# Patient Record
Sex: Female | Born: 1965
Health system: Southern US, Community
[De-identification: ages and names within clinical notes are randomized; demographics above are authoritative.]

## PROBLEM LIST (undated history)

## (undated) DIAGNOSIS — E785 Hyperlipidemia, unspecified: Secondary | ICD-10-CM

## (undated) DIAGNOSIS — R011 Cardiac murmur, unspecified: Secondary | ICD-10-CM

## (undated) DIAGNOSIS — J45909 Unspecified asthma, uncomplicated: Secondary | ICD-10-CM

## (undated) DIAGNOSIS — T8859XA Other complications of anesthesia, initial encounter: Secondary | ICD-10-CM

## (undated) DIAGNOSIS — Z973 Presence of spectacles and contact lenses: Secondary | ICD-10-CM

## (undated) DIAGNOSIS — I10 Essential (primary) hypertension: Secondary | ICD-10-CM

## (undated) DIAGNOSIS — E119 Type 2 diabetes mellitus without complications: Secondary | ICD-10-CM

## (undated) DIAGNOSIS — M1990 Unspecified osteoarthritis, unspecified site: Secondary | ICD-10-CM

## (undated) DIAGNOSIS — K219 Gastro-esophageal reflux disease without esophagitis: Secondary | ICD-10-CM

## (undated) DIAGNOSIS — T4145XA Adverse effect of unspecified anesthetic, initial encounter: Secondary | ICD-10-CM

## (undated) HISTORY — PX: TUBAL LIGATION: SHX77

## (undated) HISTORY — PX: ABDOMINAL HYSTERECTOMY: SHX81

## (undated) HISTORY — PX: TONSILLECTOMY: SUR1361

---

## 2012-05-12 ENCOUNTER — Encounter (HOSPITAL_BASED_OUTPATIENT_CLINIC_OR_DEPARTMENT_OTHER): Payer: Self-pay | Admitting: *Deleted

## 2012-05-12 ENCOUNTER — Other Ambulatory Visit: Payer: Self-pay | Admitting: Orthopedic Surgery

## 2012-05-12 NOTE — Progress Notes (Signed)
Pt at work-does not know her meds-one for htn-colesterol, pain is vicodin-to bring all meds-had never seen cardiologist pcp dr Sara Francis [facility]

## 2012-05-12 NOTE — H&P (Signed)
Sara Francis is an 47 y.o. female.   Chief Complaint: Right Knee Pain HPI: Patient reports that the anesthetic and cortisone injection in her right knee on 05/04/2012 lasted for exactly 2 hours, but gave excellent pain relief.  She is still limping with an antalgic gait and has pain along the medial joint line of her right knee and desires arthroscopic evaluation and treatment.  She has 1-2 mm of cartilage remaining to the medial compartment of the right knee and is bone-on-bone on the left knee, which is not symptomatic.  Past Medical History  Diagnosis Date  . Hypertension   . Hyperlipemia   . GERD (gastroesophageal reflux disease)     used to take nexium  . Arthritis   . Wears glasses   . Asthma   . Complication of anesthesia     hard to wake up    Past Surgical History  Procedure Laterality Date  . Abdominal hysterectomy    . Tonsillectomy    . Tubal ligation      History reviewed. No pertinent family history. Social History:  reports that she has never smoked. She does not have any smokeless tobacco history on file. She reports that she does not drink alcohol or use illicit drugs.  Allergies:  Allergies  Allergen Reactions  . Benadryl (Diphenhydramine Hcl) Swelling  . Betadine (Povidone Iodine) Itching  . Contrast Media (Iodinated Diagnostic Agents) Swelling  . Latex Itching    Swelling-even if touched by latex gloves    No prescriptions prior to admission    No results found for this or any previous visit (from the past 48 hour(s)). No results found.  Review of Systems  Constitutional: Negative.   HENT: Negative.   Eyes: Negative.   Respiratory: Negative.   Cardiovascular: Negative.   Gastrointestinal: Negative.   Genitourinary: Negative.   Musculoskeletal: Positive for joint pain.  Skin: Negative.   Neurological: Negative.   Endo/Heme/Allergies: Negative.   Psychiatric/Behavioral: Negative.     Height 5\' 7"  (1.702 m), weight 87.091 kg (192  lb). Physical Exam  Constitutional: She is oriented to person, place, and time. She appears well-developed and well-nourished.  HENT:  Head: Normocephalic.  Cardiovascular: Normal heart sounds.   Respiratory: Breath sounds normal.  GI: Soft. Bowel sounds are normal.  Neurological: She is alert and oriented to person, place, and time.  Skin: Skin is warm and dry.  Psychiatric: She has a normal mood and affect.     Assessment/Plan Assess: Probable degenerative acute on chronic tearing of the medial meniscus or chondromalacia, medial compartment right knee.  Having failed conservative treatment with observation, anti-inflammatory medicine in the past and now anesthetic and cortisone injection that provided temporary relief.  Plan: After discussing options and reviewing the risks and benefits of arthroscopy.  The patient would like to proceed with arthroscopic intervention.  I will see her back at the time of surgery.  She has no history of heart attack, stroke, bleeding ulcers or out-of-control diabetes.  She is in acceptable anesthetic risk.  Sara Francis M. 05/12/2012, 11:41 AM

## 2012-05-15 ENCOUNTER — Encounter (HOSPITAL_BASED_OUTPATIENT_CLINIC_OR_DEPARTMENT_OTHER)
Admission: RE | Disposition: A | Discharge status: Home / Self Care | Payer: Self-pay | Source: Ambulatory Visit | Attending: Orthopedic Surgery

## 2012-05-15 ENCOUNTER — Ambulatory Visit (HOSPITAL_BASED_OUTPATIENT_CLINIC_OR_DEPARTMENT_OTHER)
Admission: RE | Admit: 2012-05-15 | Discharge: 2012-05-15 | Disposition: A | Discharge status: Home / Self Care | Payer: BC Managed Care – PPO | Source: Ambulatory Visit | Attending: Orthopedic Surgery | Admitting: Orthopedic Surgery

## 2012-05-15 ENCOUNTER — Encounter (HOSPITAL_BASED_OUTPATIENT_CLINIC_OR_DEPARTMENT_OTHER): Payer: Self-pay | Admitting: Anesthesiology

## 2012-05-15 ENCOUNTER — Encounter (HOSPITAL_BASED_OUTPATIENT_CLINIC_OR_DEPARTMENT_OTHER): Payer: Self-pay | Admitting: *Deleted

## 2012-05-15 ENCOUNTER — Ambulatory Visit (HOSPITAL_BASED_OUTPATIENT_CLINIC_OR_DEPARTMENT_OTHER): Payer: BC Managed Care – PPO | Admitting: Anesthesiology

## 2012-05-15 DIAGNOSIS — M25569 Pain in unspecified knee: Secondary | ICD-10-CM | POA: Insufficient documentation

## 2012-05-15 DIAGNOSIS — I10 Essential (primary) hypertension: Secondary | ICD-10-CM | POA: Insufficient documentation

## 2012-05-15 DIAGNOSIS — M23305 Other meniscus derangements, unspecified medial meniscus, unspecified knee: Secondary | ICD-10-CM | POA: Insufficient documentation

## 2012-05-15 DIAGNOSIS — M1711 Unilateral primary osteoarthritis, right knee: Secondary | ICD-10-CM

## 2012-05-15 DIAGNOSIS — E785 Hyperlipidemia, unspecified: Secondary | ICD-10-CM | POA: Insufficient documentation

## 2012-05-15 DIAGNOSIS — M25469 Effusion, unspecified knee: Secondary | ICD-10-CM | POA: Insufficient documentation

## 2012-05-15 DIAGNOSIS — M2240 Chondromalacia patellae, unspecified knee: Secondary | ICD-10-CM | POA: Insufficient documentation

## 2012-05-15 HISTORY — DX: Cardiac murmur, unspecified: R01.1

## 2012-05-15 HISTORY — DX: Gastro-esophageal reflux disease without esophagitis: K21.9

## 2012-05-15 HISTORY — DX: Unspecified asthma, uncomplicated: J45.909

## 2012-05-15 HISTORY — DX: Presence of spectacles and contact lenses: Z97.3

## 2012-05-15 HISTORY — DX: Other complications of anesthesia, initial encounter: T88.59XA

## 2012-05-15 HISTORY — DX: Hyperlipidemia, unspecified: E78.5

## 2012-05-15 HISTORY — DX: Adverse effect of unspecified anesthetic, initial encounter: T41.45XA

## 2012-05-15 HISTORY — DX: Essential (primary) hypertension: I10

## 2012-05-15 HISTORY — DX: Unspecified osteoarthritis, unspecified site: M19.90

## 2012-05-15 HISTORY — PX: KNEE ARTHROSCOPY: SHX127

## 2012-05-15 LAB — POCT I-STAT, CHEM 8
Calcium, Ion: 1.1 mmol/L — ABNORMAL LOW (ref 1.12–1.23)
HCT: 40 % (ref 36.0–46.0)
Hemoglobin: 13.6 g/dL (ref 12.0–15.0)
TCO2: 25 mmol/L (ref 0–100)

## 2012-05-15 SURGERY — ARTHROSCOPY, KNEE
Anesthesia: General | Site: Knee | Laterality: Right | Wound class: Clean

## 2012-05-15 MED ORDER — BUPIVACAINE-EPINEPHRINE 0.5% -1:200000 IJ SOLN
INTRAMUSCULAR | Status: DC | PRN
  Administered 2012-05-15: 30 mL

## 2012-05-15 MED ORDER — OXYCODONE HCL 5 MG/5ML PO SOLN: 5.0000 mg | Freq: Once | ORAL | Status: DC | PRN

## 2012-05-15 MED ORDER — LIDOCAINE HCL (CARDIAC) 20 MG/ML IV SOLN
INTRAVENOUS | Status: DC | PRN
  Administered 2012-05-15: 20 mg via INTRAVENOUS

## 2012-05-15 MED ORDER — PROMETHAZINE HCL 25 MG/ML IJ SOLN: 6.2500 mg | INTRAMUSCULAR | Status: DC | PRN

## 2012-05-15 MED ORDER — SUCCINYLCHOLINE CHLORIDE 20 MG/ML IJ SOLN
INTRAMUSCULAR | Status: DC | PRN
  Administered 2012-05-15: 100 mg via INTRAVENOUS

## 2012-05-15 MED ORDER — SODIUM CHLORIDE 0.9 % IR SOLN
Status: DC | PRN
  Administered 2012-05-15: 12:00:00

## 2012-05-15 MED ORDER — OXYCODONE HCL 5 MG PO TABS
5.0000 mg | ORAL_TABLET | Freq: Once | ORAL | Status: DC | PRN
Start: 2012-05-15 — End: 2012-05-15

## 2012-05-15 MED ORDER — FENTANYL CITRATE 0.05 MG/ML IJ SOLN: 50.0000 ug | INTRAMUSCULAR | Status: DC | PRN

## 2012-05-15 MED ORDER — ACETAMINOPHEN 10 MG/ML IV SOLN
1000.0000 mg | Freq: Once | INTRAVENOUS | Status: AC
  Administered 2012-05-15: 1000 mg via INTRAVENOUS

## 2012-05-15 MED ORDER — LACTATED RINGERS IV SOLN
INTRAVENOUS | Status: DC
  Administered 2012-05-15 (×2): via INTRAVENOUS

## 2012-05-15 MED ORDER — MIDAZOLAM HCL 5 MG/5ML IJ SOLN
INTRAMUSCULAR | Status: DC | PRN
  Administered 2012-05-15: 2 mg via INTRAVENOUS

## 2012-05-15 MED ORDER — PROPOFOL 10 MG/ML IV BOLUS
INTRAVENOUS | Status: DC | PRN
  Administered 2012-05-15: 100 mg via INTRAVENOUS
  Administered 2012-05-15: 200 mg via INTRAVENOUS

## 2012-05-15 MED ORDER — MIDAZOLAM HCL 2 MG/2ML IJ SOLN
0.5000 mg | Freq: Once | INTRAMUSCULAR | Status: DC | PRN
Start: 2012-05-15 — End: 2012-05-15

## 2012-05-15 MED ORDER — ONDANSETRON HCL 4 MG/2ML IJ SOLN
INTRAMUSCULAR | Status: DC | PRN
  Administered 2012-05-15: 4 mg via INTRAVENOUS

## 2012-05-15 MED ORDER — CHLORHEXIDINE GLUCONATE 4 % EX LIQD: 60.0000 mL | Freq: Once | CUTANEOUS | Status: DC

## 2012-05-15 MED ORDER — FENTANYL CITRATE 0.05 MG/ML IJ SOLN
INTRAMUSCULAR | Status: DC | PRN
  Administered 2012-05-15 (×2): 50 ug via INTRAVENOUS
  Administered 2012-05-15: 100 ug via INTRAVENOUS

## 2012-05-15 MED ORDER — DEXAMETHASONE SODIUM PHOSPHATE 4 MG/ML IJ SOLN
INTRAMUSCULAR | Status: DC | PRN
  Administered 2012-05-15: 10 mg via INTRAVENOUS

## 2012-05-15 MED ORDER — MIDAZOLAM HCL 2 MG/2ML IJ SOLN: 1.0000 mg | INTRAMUSCULAR | Status: DC | PRN

## 2012-05-15 MED ORDER — HYDROCODONE-ACETAMINOPHEN 5-325 MG PO TABS: 1.0000 | ORAL_TABLET | ORAL | Status: DC | PRN

## 2012-05-15 MED ORDER — DEXTROSE-NACL 5-0.45 % IV SOLN: INTRAVENOUS | Status: DC

## 2012-05-15 MED ORDER — CEFAZOLIN SODIUM-DEXTROSE 2-3 GM-% IV SOLR
2.0000 g | INTRAVENOUS | Status: AC
  Administered 2012-05-15: 2 g via INTRAVENOUS

## 2012-05-15 MED ORDER — FENTANYL CITRATE 0.05 MG/ML IJ SOLN
25.0000 ug | INTRAMUSCULAR | Status: DC | PRN
  Administered 2012-05-15 (×2): 25 ug via INTRAVENOUS

## 2012-05-15 MED ORDER — MEPERIDINE HCL 25 MG/ML IJ SOLN: 6.2500 mg | INTRAMUSCULAR | Status: DC | PRN

## 2012-05-15 SURGICAL SUPPLY — 42 items
BANDAGE ELASTIC 6 VELCRO ST LF (GAUZE/BANDAGES/DRESSINGS) ×2 IMPLANT
BLADE 4.2CUDA (BLADE) IMPLANT
BLADE CUTTER GATOR 3.5 (BLADE) ×2 IMPLANT
BLADE GREAT WHITE 4.2 (BLADE) IMPLANT
CANISTER OMNI JUG 16 LITER (MISCELLANEOUS) IMPLANT
CANISTER SUCTION 2500CC (MISCELLANEOUS) IMPLANT
CHLORAPREP W/TINT 26ML (MISCELLANEOUS) ×4 IMPLANT
CLOTH BEACON ORANGE TIMEOUT ST (SAFETY) ×2 IMPLANT
DRAPE ARTHROSCOPY W/POUCH 114 (DRAPES) ×2 IMPLANT
ELECT MENISCUS 165MM 90D (ELECTRODE) IMPLANT
ELECT REM PT RETURN 9FT ADLT (ELECTROSURGICAL)
ELECTRODE REM PT RTRN 9FT ADLT (ELECTROSURGICAL) IMPLANT
GAUZE XEROFORM 1X8 LF (GAUZE/BANDAGES/DRESSINGS) ×2 IMPLANT
GLOVE BIO SURGEON STRL SZ7 (GLOVE) IMPLANT
GLOVE BIO SURGEON STRL SZ7.5 (GLOVE) IMPLANT
GLOVE BIOGEL PI IND STRL 7.0 (GLOVE) ×2 IMPLANT
GLOVE BIOGEL PI IND STRL 8 (GLOVE) ×1 IMPLANT
GLOVE BIOGEL PI INDICATOR 7.0 (GLOVE) ×2
GLOVE BIOGEL PI INDICATOR 8 (GLOVE) ×1
GLOVE SKINSENSE NS SZ6.5 (GLOVE) ×1
GLOVE SKINSENSE NS SZ7.0 (GLOVE) ×1
GLOVE SKINSENSE NS SZ7.5 (GLOVE) ×1
GLOVE SKINSENSE STRL SZ6.5 (GLOVE) ×1 IMPLANT
GLOVE SKINSENSE STRL SZ7.0 (GLOVE) ×1 IMPLANT
GLOVE SKINSENSE STRL SZ7.5 (GLOVE) ×1 IMPLANT
GOWN PREVENTION PLUS XLARGE (GOWN DISPOSABLE) ×4 IMPLANT
IV NS IRRIG 3000ML ARTHROMATIC (IV SOLUTION) ×4 IMPLANT
KNEE WRAP E Z 3 GEL PACK (MISCELLANEOUS) ×2 IMPLANT
NDL SAFETY ECLIPSE 18X1.5 (NEEDLE) ×1 IMPLANT
NEEDLE HYPO 18GX1.5 SHARP (NEEDLE) ×1
PACK ARTHROSCOPY DSU (CUSTOM PROCEDURE TRAY) ×2 IMPLANT
PACK BASIN DAY SURGERY FS (CUSTOM PROCEDURE TRAY) ×2 IMPLANT
PENCIL BUTTON HOLSTER BLD 10FT (ELECTRODE) IMPLANT
SET ARTHROSCOPY TUBING (MISCELLANEOUS) ×1
SET ARTHROSCOPY TUBING LN (MISCELLANEOUS) ×1 IMPLANT
SLEEVE SCD COMPRESS KNEE MED (MISCELLANEOUS) IMPLANT
SPONGE GAUZE 4X4 12PLY (GAUZE/BANDAGES/DRESSINGS) ×2 IMPLANT
SYR 3ML 18GX1 1/2 (SYRINGE) ×2 IMPLANT
SYR 5ML LL (SYRINGE) IMPLANT
TOWEL OR 17X24 6PK STRL BLUE (TOWEL DISPOSABLE) ×2 IMPLANT
WAND STAR VAC 90 (SURGICAL WAND) IMPLANT
WATER STERILE IRR 1000ML POUR (IV SOLUTION) ×2 IMPLANT

## 2012-05-15 NOTE — Op Note (Signed)
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   Procedure: Right knee arthroscopic chondroplasty medial femoral condyle  Surgeon: Feliberto Gottron. Turner Daniels M.D.  Assist: Shirl Harris PA-C  Anes: General LMA  EBL: Minimal  Fluids: 800 cc   Indications: Greater than three-month history of right knee effusion with catching popping and pain. Has had previous arthroscopic surgery on the knee many years ago that worked well. Reports that these symptoms are similar to those that she had 10 years ago. Pain is almost made her fall down a number of times she walks with antalgic gait and the pain also wakes her up at night. It does interfere with chores and work. Pt has failed conservative treatment with anti-inflammatory medicines, physical therapy, and modified activites but did get good temporarily from an intra-articular cortisone injection. Pain has recurred and patient desires elective arthroscopic evaluation and treatment of knee. Risks and benefits of surgery have been discussed and questions answered.  Procedure: Patient identified by arm band and taken to the operating room at the day surgery [facility]. The appropriate anesthetic monitors were attached, and General LMA anesthesia was induced without difficulty. Lateral post was applied to the table and the lower extremity was prepped and draped in usual sterile fashion from the ankle to the midthigh. Time out procedure was performed. We began the operation by making standard inferior lateral and inferior medial peripatellar portals with a #11 blade allowing introduction of the arthroscope through the inferior lateral portal and the out flow to the inferior medial portal. Pump pressure was set at 100 mmHg and diagnostic arthroscopy  revealed normal patellofemoral articular cartilage, the medial compartment meniscus was normal the  medial femoral condyle had 2 areas of grade 3 chondromalacia with flap tears debrided back to a stable margin with a 3.5 mm Gator sucker shaver. He tear chondromalacia was about 10 mm in diameter. The anterior cruciate ligament and PCL were intact and probed the lateral compartment articular and meniscal cartilages were in excellent condition. The gutters were cleared medially and laterally. The knee was irrigated out normal saline solution. A dressing of xerofoam 4 x 4 dressing sponges, web roll and an Ace wrap was applied. The patient was awakened extubated and taken to the recovery without difficulty.    Signed: Nestor Lewandowsky, MD

## 2012-05-15 NOTE — Interval H&P Note (Signed)
History and Physical Interval Note:  05/15/2012 11:28 AM  Sara Francis  has presented today for surgery, with the diagnosis of Right Knee Medial Meniscal Tear Chondomylasia  The various methods of treatment have been discussed with the patient and family. After consideration of risks, benefits and other options for treatment, the patient has consented to  Procedure(s): RIGHT KNEE ARTHROSCOPY  (Right) as a surgical intervention .  The patient's history has been reviewed, patient examined, no change in status, stable for surgery.  I have reviewed the patient's chart and labs.  Questions were answered to the patient's satisfaction.     Nestor Lewandowsky

## 2012-05-15 NOTE — Anesthesia Procedure Notes (Addendum)
Performed by: Signa Kell C   Procedure Name: Intubation Date/Time: 05/15/2012 11:47 AM Performed by: Burna Cash Pre-anesthesia Checklist: Patient identified, Emergency Drugs available, Suction available and Patient being monitored Patient Re-evaluated:Patient Re-evaluated prior to inductionOxygen Delivery Method: Circle System Utilized Preoxygenation: Pre-oxygenation with 100% oxygen Intubation Type: IV induction Ventilation: Mask ventilation without difficulty Laryngoscope Size: Mac and 3 Grade View: Grade I Tube type: Oral Tube size: 7.0 mm Number of attempts: 1 Airway Equipment and Method: stylet and oral airway Placement Confirmation: ETT inserted through vocal cords under direct vision,  positive ETCO2 and breath sounds checked- equal and bilateral Secured at: 22 cm Tube secured with: Tape Dental Injury: Teeth and Oropharynx as per pre-operative assessment

## 2012-05-15 NOTE — Interval H&P Note (Signed)
History and Physical Interval Note:  05/15/2012 11:29 AM  Sara Francis  has presented today for surgery, with the diagnosis of Right Knee Medial Meniscal Tear Chondomylasia  The various methods of treatment have been discussed with the patient and family. After consideration of risks, benefits and other options for treatment, the patient has consented to  Procedure(s): RIGHT KNEE ARTHROSCOPY  (Right) as a surgical intervention .  The patient's history has been reviewed, patient examined, no change in status, stable for surgery.  I have reviewed the patient's chart and labs.  Questions were answered to the patient's satisfaction.     Nestor Lewandowsky

## 2012-05-15 NOTE — Anesthesia Preprocedure Evaluation (Signed)
Anesthesia Evaluation  Patient identified by MRN, date of birth, ID band Patient awake    Reviewed: Allergy & Precautions, H&P , NPO status , Patient's Chart, lab work & pertinent test results  History of Anesthesia Complications (+) PROLONGED EMERGENCE  Airway Mallampati: II TM Distance: >3 FB Neck ROM: Full    Dental  (+) Dental Advisory Given   Pulmonary asthma (wheezes daily) ,  breath sounds clear to auscultation  Pulmonary exam normal       Cardiovascular hypertension, Pt. on medications Rhythm:Regular Rate:Normal     Neuro/Psych negative neurological ROS  negative psych ROS   GI/Hepatic Neg liver ROS, GERD-  Poorly Controlled and Medicated,  Endo/Other  negative endocrine ROS  Renal/GU negative Renal ROS     Musculoskeletal   Abdominal (+) + obese,   Peds  Hematology negative hematology ROS (+)   Anesthesia Other Findings   Reproductive/Obstetrics negative OB ROS                           Anesthesia Physical Anesthesia Plan  ASA: II  Anesthesia Plan: General   Post-op Pain Management:    Induction: Intravenous  Airway Management Planned: Oral ETT  Additional Equipment:   Intra-op Plan:   Post-operative Plan: Extubation in OR  Informed Consent: I have reviewed the patients History and Physical, chart, labs and discussed the procedure including the risks, benefits and alternatives for the proposed anesthesia with the patient or authorized representative who has indicated his/her understanding and acceptance.   Dental advisory given  Plan Discussed with: Surgeon and CRNA  Anesthesia Plan Comments: (Plan routine monitors, GETA)        Anesthesia Quick Evaluation

## 2012-05-15 NOTE — Transfer of Care (Signed)
Immediate Anesthesia Transfer of Care Note  Patient: Sara Francis  Procedure(s) Performed: Procedure(s): RIGHT KNEE ARTHROSCOPY  and CHONDROPLASTY (Right)  Patient Location: PACU  Anesthesia Type:General  Level of Consciousness: awake, alert  and oriented  Airway & Oxygen Therapy: Patient Spontanous Breathing and Patient connected to face mask oxygen  Post-op Assessment: Report given to PACU RN and Post -op Vital signs reviewed and stable  Post vital signs: Reviewed and stable  Complications: No apparent anesthesia complications

## 2012-05-15 NOTE — Anesthesia Postprocedure Evaluation (Signed)
  Anesthesia Post-op Note  Patient: Sara Francis  Procedure(s) Performed: Procedure(s): RIGHT KNEE ARTHROSCOPY  and CHONDROPLASTY (Right)  Patient Location: PACU  Anesthesia Type:General  Level of Consciousness: awake, oriented and patient cooperative, but sleepy  Airway and Oxygen Therapy: Patient Spontanous Breathing  Post-op Pain: mild  Post-op Assessment: Post-op Vital signs reviewed, Patient's Cardiovascular Status Stable, Respiratory Function Stable, Patent Airway, No signs of Nausea or vomiting and Pain level controlled  Post-op Vital Signs: Reviewed and stable  Complications: No apparent anesthesia complications

## 2012-05-16 ENCOUNTER — Encounter (HOSPITAL_BASED_OUTPATIENT_CLINIC_OR_DEPARTMENT_OTHER): Payer: Self-pay | Admitting: Orthopedic Surgery

## 2012-06-26 ENCOUNTER — Other Ambulatory Visit: Payer: Self-pay | Admitting: Orthopedic Surgery

## 2012-06-26 DIAGNOSIS — M79641 Pain in right hand: Secondary | ICD-10-CM

## 2012-06-27 ENCOUNTER — Inpatient Hospital Stay: Admission: RE | Admit: 2012-06-27 | Payer: BC Managed Care – PPO | Source: Ambulatory Visit

## 2012-07-04 ENCOUNTER — Ambulatory Visit
Admission: RE | Admit: 2012-07-04 | Discharge: 2012-07-04 | Disposition: A | Discharge status: Home / Self Care | Payer: BC Managed Care – PPO | Source: Ambulatory Visit | Attending: Orthopedic Surgery | Admitting: Orthopedic Surgery

## 2012-07-04 DIAGNOSIS — M79641 Pain in right hand: Secondary | ICD-10-CM

## 2012-09-15 ENCOUNTER — Other Ambulatory Visit: Payer: Self-pay | Admitting: Orthopedic Surgery

## 2012-09-15 DIAGNOSIS — S62102A Fracture of unspecified carpal bone, left wrist, initial encounter for closed fracture: Secondary | ICD-10-CM

## 2012-09-20 ENCOUNTER — Ambulatory Visit
Admission: RE | Admit: 2012-09-20 | Discharge: 2012-09-20 | Disposition: A | Discharge status: Home / Self Care | Payer: BC Managed Care – PPO | Source: Ambulatory Visit | Attending: Orthopedic Surgery | Admitting: Orthopedic Surgery

## 2012-09-20 ENCOUNTER — Other Ambulatory Visit: Payer: BC Managed Care – PPO

## 2012-09-20 DIAGNOSIS — S62102A Fracture of unspecified carpal bone, left wrist, initial encounter for closed fracture: Secondary | ICD-10-CM

## 2014-04-17 ENCOUNTER — Emergency Department (HOSPITAL_BASED_OUTPATIENT_CLINIC_OR_DEPARTMENT_OTHER)
Admission: EM | Admit: 2014-04-17 | Discharge: 2014-04-17 | Disposition: A | Discharge status: Home / Self Care | Payer: Self-pay | Attending: Emergency Medicine | Admitting: Emergency Medicine

## 2014-04-17 ENCOUNTER — Emergency Department (HOSPITAL_BASED_OUTPATIENT_CLINIC_OR_DEPARTMENT_OTHER): Payer: Self-pay

## 2014-04-17 ENCOUNTER — Encounter (HOSPITAL_BASED_OUTPATIENT_CLINIC_OR_DEPARTMENT_OTHER): Payer: Self-pay | Admitting: *Deleted

## 2014-04-17 DIAGNOSIS — S8392XA Sprain of unspecified site of left knee, initial encounter: Secondary | ICD-10-CM | POA: Insufficient documentation

## 2014-04-17 DIAGNOSIS — M1990 Unspecified osteoarthritis, unspecified site: Secondary | ICD-10-CM | POA: Insufficient documentation

## 2014-04-17 DIAGNOSIS — W108XXA Fall (on) (from) other stairs and steps, initial encounter: Secondary | ICD-10-CM | POA: Insufficient documentation

## 2014-04-17 DIAGNOSIS — Z79899 Other long term (current) drug therapy: Secondary | ICD-10-CM | POA: Insufficient documentation

## 2014-04-17 DIAGNOSIS — Y9289 Other specified places as the place of occurrence of the external cause: Secondary | ICD-10-CM | POA: Insufficient documentation

## 2014-04-17 DIAGNOSIS — S63502A Unspecified sprain of left wrist, initial encounter: Secondary | ICD-10-CM | POA: Insufficient documentation

## 2014-04-17 DIAGNOSIS — R011 Cardiac murmur, unspecified: Secondary | ICD-10-CM | POA: Insufficient documentation

## 2014-04-17 DIAGNOSIS — Z8639 Personal history of other endocrine, nutritional and metabolic disease: Secondary | ICD-10-CM | POA: Insufficient documentation

## 2014-04-17 DIAGNOSIS — W19XXXA Unspecified fall, initial encounter: Secondary | ICD-10-CM

## 2014-04-17 DIAGNOSIS — Y9389 Activity, other specified: Secondary | ICD-10-CM | POA: Insufficient documentation

## 2014-04-17 DIAGNOSIS — K219 Gastro-esophageal reflux disease without esophagitis: Secondary | ICD-10-CM | POA: Insufficient documentation

## 2014-04-17 DIAGNOSIS — I10 Essential (primary) hypertension: Secondary | ICD-10-CM | POA: Insufficient documentation

## 2014-04-17 DIAGNOSIS — J45909 Unspecified asthma, uncomplicated: Secondary | ICD-10-CM | POA: Insufficient documentation

## 2014-04-17 DIAGNOSIS — Z91040 Latex allergy status: Secondary | ICD-10-CM | POA: Insufficient documentation

## 2014-04-17 DIAGNOSIS — Y998 Other external cause status: Secondary | ICD-10-CM | POA: Insufficient documentation

## 2014-04-17 MED ORDER — IBUPROFEN 800 MG PO TABS
800.0000 mg | ORAL_TABLET | Freq: Once | ORAL | Status: AC
  Administered 2014-04-17: 800 mg via ORAL
  Filled 2014-04-17: qty 1

## 2014-04-17 MED ORDER — IBUPROFEN 800 MG PO TABS: 800.0000 mg | ORAL_TABLET | Freq: Three times a day (TID) | ORAL | Status: DC | PRN

## 2014-04-17 NOTE — ED Provider Notes (Signed)
CSN: [id number]     Arrival date & time 04/17/14  1729 History  This chart was scribed for Audree CamelScott T Iracema Lanagan, MD by [facility] LPNadim Abu Hashem, ED Scribe. The patient was seen in MH04/[facility]04 and the patient's care was started at 6:10 PM.  Chief Complaint  Patient presents with  . Knee Pain   Patient is a 49 y.o. female presenting with knee pain. The history is provided by the patient. No language interpreter was used.  Knee Pain   HPI Comments: Sara Francis is a 49 y.o. female who presents to the Emergency Department complaining of left knee pain and left wrist pain due to a fall which occurred last week. Pt fell while walking down the stairs. She says her legs gave out and she tripped. She states her left knee and left wrist are swollen. Pt has pain with ambulating. Pt has tried alternating ice and heat for no relief. She has not tried OTC medication. No trauma to her head, no LOC and no ankle pain.  Past Medical History  Diagnosis Date  . Hyperlipemia   . GERD (gastroesophageal reflux disease)     used to take nexium  . Arthritis   . Wears glasses   . Asthma   . Complication of anesthesia     hard to wake up  . Heart murmur   . Hypertension     under control with med., has been on med. x "a while"    Past Surgical History  Procedure Laterality Date  . Abdominal hysterectomy    . Tonsillectomy    . Tubal ligation    . Knee arthroscopy Right 05/15/2012    Procedure: RIGHT KNEE ARTHROSCOPY  and CHONDROPLASTY;  Surgeon: Nestor LewandowskyFrank J Rowan, MD;  Location: [facility];  Service: Orthopedics;  Laterality: Right;   No family history on file. History  Substance Use Topics  . Smoking status: Never Smoker   . Smokeless tobacco: Not on file  . Alcohol Use: No   OB History    No data available     Review of Systems  Musculoskeletal: Positive for joint swelling, arthralgias and gait problem.  All other systems reviewed and are negative.  Allergies  Benadryl; Betadine; Contrast  media; and Latex  Home Medications   Prior to Admission medications   Medication Sig Start Date End Date Taking? Authorizing Provider  albuterol (PROVENTIL HFA;VENTOLIN HFA) 108 (90 BASE) MCG/ACT inhaler Inhale 2 puffs into the lungs every 6 (six) hours as needed for wheezing.   Yes Historical Provider, MD  pantoprazole (PROTONIX) 40 MG tablet Take 40 mg by mouth daily.   Yes Historical Provider, MD  amLODipine (NORVASC) 5 MG tablet Take 5 mg by mouth.    Historical Provider, MD  HYDROcodone-acetaminophen (NORCO) 5-325 MG per tablet Take 1-2 tablets by mouth every 4 (four) hours as needed for pain. 05/15/12   Alessandra BevelsJennifer M. Williams, PA-C   BP 176/90 mmHg  Pulse 92  Temp(Src) 99 F (37.2 C) (Oral)  Resp 18  SpO2 100% Physical Exam  Constitutional: She is oriented to person, place, and time. She appears well-developed and well-nourished. No distress.  HENT:  Head: Normocephalic and atraumatic.  Eyes: Right eye exhibits no discharge. Left eye exhibits no discharge.  Cardiovascular:  Pulses:      Radial pulses are 2+ on the right side, and 2+ on the left side.       Dorsalis pedis pulses are 2+ on the right side, and 2+ on the left  side.  Pulmonary/Chest: Effort normal. No respiratory distress.  Musculoskeletal: She exhibits tenderness.  Left wrist: diffuse tenderness. Mildly limited ROM. Mild swelling Normal strength and sensation. Left knee: Diffuse tenderness. No significant swelling. Mildly limited ROM.  Neurological: She is alert and oriented to person, place, and time.  Skin: Skin is warm and dry.  Psychiatric: She has a normal mood and affect. Her behavior is normal.  Nursing note and vitals reviewed.   ED Course  Procedures  DIAGNOSTIC STUDIES: Oxygen Saturation is 100% on room air, normal by my interpretation.    COORDINATION OF CARE: 6:15 PM Discussed treatment plan with pt at bedside and pt agreed to plan.  Labs Review Labs Reviewed - No data to display  Imaging  Review Dg Wrist Complete Left  04/17/2014   CLINICAL DATA:  Status post fall downstairs all week ago. Left wrist pain.  EXAM: LEFT WRIST - COMPLETE 3+ VIEW  COMPARISON:  CT of the left left wrist 11/17/2012.  FINDINGS: No acute bony or joint abnormality is identified. Soft tissues about the wrist appear mildly swollen. Ulnar minus variance is noted.  IMPRESSION: Soft tissue swelling without acute bony or joint abnormality.  Ulnar minus variance.   Electronically Signed   By: Drusilla Kanner M.D.   On: 04/17/2014 19:02   Dg Knee Complete 4 Views Left  04/17/2014   CLINICAL DATA:  Status post fall down stairs approximately 1 week ago. Left knee pain since the injury. Initial encounter.  EXAM: LEFT KNEE - COMPLETE 4+ VIEW  COMPARISON:  Report of plain films left knee 03/21/2013 reviewed. Images are not available.  FINDINGS: There is no acute bony or joint abnormality. Tricompartmental osteoarthritis is seen and worst in the medial compartment where there is near bone-on-bone joint space narrowing and prominent osteophytosis. No joint effusion is noted.  IMPRESSION: No acute abnormality.  Tricompartmental osteoarthritis, worst medially.   Electronically Signed   By: Drusilla Kanner M.D.   On: 04/17/2014 19:01     EKG Interpretation None      MDM   Final diagnoses:  Fall, initial encounter  Left wrist sprain, initial encounter  Left knee sprain, initial encounter    Patient's x-rays are benign. No signs of any fractures. Normal neurologic and vascular exam. Did not hit her head or neck. Will treat with ibuprofen, ice, and rest. Recommend follow-up with PCP as needed.  I personally performed the services described in this documentation, which was scribed in my presence. The recorded information has been reviewed and is accurate.      Audree Camel, MD 04/17/14 [phone]

## 2014-04-17 NOTE — ED Notes (Signed)
Patient asked to change into a gown.  

## 2014-04-17 NOTE — ED Notes (Signed)
Pt reports trip and fall down 3 steps last week, cont with left wrist and left knee pain. Pt states she has been using ice, with no relief.

## 2014-04-17 NOTE — ED Notes (Signed)
Pt reports she was walking down the steps and her legs gave out and she fell down 3 steps, c/o pain in left knee and wrist

## 2014-07-03 ENCOUNTER — Emergency Department (HOSPITAL_BASED_OUTPATIENT_CLINIC_OR_DEPARTMENT_OTHER)
Admission: EM | Admit: 2014-07-03 | Discharge: 2014-07-03 | Disposition: A | Discharge status: Home / Self Care | Payer: Self-pay | Attending: Emergency Medicine | Admitting: Emergency Medicine

## 2014-07-03 ENCOUNTER — Emergency Department (HOSPITAL_BASED_OUTPATIENT_CLINIC_OR_DEPARTMENT_OTHER): Payer: Self-pay

## 2014-07-03 ENCOUNTER — Encounter (HOSPITAL_BASED_OUTPATIENT_CLINIC_OR_DEPARTMENT_OTHER): Payer: Self-pay

## 2014-07-03 DIAGNOSIS — Z91040 Latex allergy status: Secondary | ICD-10-CM | POA: Insufficient documentation

## 2014-07-03 DIAGNOSIS — S60221A Contusion of right hand, initial encounter: Secondary | ICD-10-CM | POA: Insufficient documentation

## 2014-07-03 DIAGNOSIS — I10 Essential (primary) hypertension: Secondary | ICD-10-CM | POA: Insufficient documentation

## 2014-07-03 DIAGNOSIS — Y9289 Other specified places as the place of occurrence of the external cause: Secondary | ICD-10-CM | POA: Insufficient documentation

## 2014-07-03 DIAGNOSIS — W231XXA Caught, crushed, jammed, or pinched between stationary objects, initial encounter: Secondary | ICD-10-CM | POA: Insufficient documentation

## 2014-07-03 DIAGNOSIS — K219 Gastro-esophageal reflux disease without esophagitis: Secondary | ICD-10-CM | POA: Insufficient documentation

## 2014-07-03 DIAGNOSIS — J45909 Unspecified asthma, uncomplicated: Secondary | ICD-10-CM | POA: Insufficient documentation

## 2014-07-03 DIAGNOSIS — M1990 Unspecified osteoarthritis, unspecified site: Secondary | ICD-10-CM | POA: Insufficient documentation

## 2014-07-03 DIAGNOSIS — Y998 Other external cause status: Secondary | ICD-10-CM | POA: Insufficient documentation

## 2014-07-03 DIAGNOSIS — Z8639 Personal history of other endocrine, nutritional and metabolic disease: Secondary | ICD-10-CM | POA: Insufficient documentation

## 2014-07-03 DIAGNOSIS — R011 Cardiac murmur, unspecified: Secondary | ICD-10-CM | POA: Insufficient documentation

## 2014-07-03 DIAGNOSIS — Z79899 Other long term (current) drug therapy: Secondary | ICD-10-CM | POA: Insufficient documentation

## 2014-07-03 DIAGNOSIS — Y9389 Activity, other specified: Secondary | ICD-10-CM | POA: Insufficient documentation

## 2014-07-03 MED ORDER — HYDROCODONE-ACETAMINOPHEN 5-325 MG PO TABS: 1.0000 | ORAL_TABLET | Freq: Four times a day (QID) | ORAL | Status: DC | PRN

## 2014-07-03 MED ORDER — IBUPROFEN 800 MG PO TABS
800.0000 mg | ORAL_TABLET | Freq: Three times a day (TID) | ORAL | Status: DC | PRN
Start: 2014-07-03 — End: 2014-08-12

## 2014-07-03 MED ORDER — HYDROCODONE-ACETAMINOPHEN 5-325 MG PO TABS
2.0000 | ORAL_TABLET | Freq: Once | ORAL | Status: AC
Start: 2014-07-03 — End: 2014-07-03
  Administered 2014-07-03: 2 via ORAL
  Filled 2014-07-03: qty 2

## 2014-07-03 MED ORDER — IBUPROFEN 800 MG PO TABS
800.0000 mg | ORAL_TABLET | Freq: Once | ORAL | Status: AC
  Administered 2014-07-03: 800 mg via ORAL
  Filled 2014-07-03: qty 1

## 2014-07-03 NOTE — ED Notes (Signed)
Reports closing right hand in door earlier today. Pain to entire right hand and fingers

## 2014-07-03 NOTE — Discharge Instructions (Signed)
Take motrin for pain.  Take vicodin for severe pain. Do NOT drive with it.   Apply ice.   Follow up with your doctor.   Return to ER if you have severe pain, unable to feel your fingers.

## 2014-07-03 NOTE — ED Provider Notes (Signed)
CSN: [id number]     Arrival date & time 07/03/14  1728 History   First MD Initiated Contact with Patient 07/03/14 1735     Chief Complaint  Patient presents with  . Hand Injury     (Consider location/radiation/quality/duration/timing/severity/associated sxs/prior Treatment) The history is provided by the patient.  Sara Francis is a 49 y.o. female hx of HL, GERD, here presenting with right hand injury. She states that she was parked in a hotel and the car door as suddenly closed on her right hand. She states that she has swelling of the right hand and fingers and wrist. Denies any other injuries.    Past Medical History  Diagnosis Date  . Hyperlipemia   . GERD (gastroesophageal reflux disease)     used to take nexium  . Arthritis   . Wears glasses   . Asthma   . Complication of anesthesia     hard to wake up  . Heart murmur   . Hypertension     under control with med., has been on med. x "a while"    Past Surgical History  Procedure Laterality Date  . Abdominal hysterectomy    . Tonsillectomy    . Tubal ligation    . Knee arthroscopy Right 05/15/2012    Procedure: RIGHT KNEE ARTHROSCOPY  and CHONDROPLASTY;  Surgeon: Nestor LewandowskyFrank J Rowan, MD;  Location: [facility];  Service: Orthopedics;  Laterality: Right;   No family history on file. History  Substance Use Topics  . Smoking status: Never Smoker   . Smokeless tobacco: Not on file  . Alcohol Use: No   OB History    No data available     Review of Systems  Musculoskeletal:       R hand and wrist pain   All other systems reviewed and are negative.     Allergies  Benadryl; Betadine; Contrast media; and Latex  Home Medications   Prior to Admission medications   Medication Sig Start Date End Date Taking? Authorizing Provider  albuterol (PROVENTIL HFA;VENTOLIN HFA) 108 (90 BASE) MCG/ACT inhaler Inhale 2 puffs into the lungs every 6 (six) hours as needed for wheezing.    Historical Provider, MD   amLODipine (NORVASC) 5 MG tablet Take 5 mg by mouth.    Historical Provider, MD  HYDROcodone-acetaminophen (NORCO) 5-325 MG per tablet Take 1-2 tablets by mouth every 4 (four) hours as needed for pain. 05/15/12   Shirl HarrisJennifer Williams, PA-C  ibuprofen (ADVIL,MOTRIN) 800 MG tablet Take 1 tablet (800 mg total) by mouth every 8 (eight) hours as needed for moderate pain. 04/17/14   Pricilla LovelessScott Goldston, MD  pantoprazole (PROTONIX) 40 MG tablet Take 40 mg by mouth daily.    Historical Provider, MD   BP 164/88 mmHg  Pulse 67  Temp(Src) 98.3 F (36.8 C) (Oral)  Resp 18  Ht 5\' 7"  (1.702 m)  Wt 192 lb (87.091 kg)  BMI 30.06 kg/m2  SpO2 99% Physical Exam  Constitutional: She is oriented to person, place, and time.  Uncomfortable   HENT:  Head: Normocephalic.  Mouth/Throat: Oropharynx is clear and moist.  Eyes: Pupils are equal, round, and reactive to light.  Neck: Normal range of motion.  Cardiovascular: Normal rate.   Pulmonary/Chest: Effort normal.  Abdominal: Soft.  Musculoskeletal:  R hand swelling MCP, worse 2nd MCP. Mild tenderness R wrist with dec ROM. Nl capillary refill. 2+ pulses   Neurological: She is alert and oriented to person, place, and time.  Skin: Skin is  warm and dry.  Psychiatric: She has a normal mood and affect. Her behavior is normal. Judgment and thought content normal.  Nursing note and vitals reviewed.   ED Course  Procedures (including critical care time) Labs Review Labs Reviewed - No data to display  Imaging Review Dg Wrist Complete Right  07/03/2014   CLINICAL DATA:  Car door closed on RIGHT hand earlier today, pain throughout entire RIGHT hand and wrist  EXAM: RIGHT WRIST - COMPLETE 3+ VIEW  COMPARISON:  CT RIGHT wrist 07/04/2012  FINDINGS: Osseous mineralization normal.  Joint spaces preserved.  No fracture, dislocation, or bone destruction.  IMPRESSION: Normal exam, unchanged.   Electronically Signed   By: Ulyses SouthwardMark  Boles M.D.   On: 07/03/2014 18:06   Dg Hand  Complete Right  07/03/2014   CLINICAL DATA:  Crush injury right hand with a car door today. Pain. Initial encounter.  EXAM: RIGHT HAND - COMPLETE 3+ VIEW  COMPARISON:  None.  FINDINGS: No acute bony or joint abnormality is identified. Degenerative change is noted about the IP joint of the thumb. Soft tissues are unremarkable.  IMPRESSION: No acute abnormality.   Electronically Signed   By: Drusilla Kannerhomas  Dalessio M.D.   On: 07/03/2014 18:04     EKG Interpretation None      MDM   Final diagnoses:  None    Sara SeatsYvette Francis is a 49 y.o. female here with R hand and wrist injury. Likely contusion, will get xrays and give pain meds.   6:31 PM Xray showed no fracture. Likely contusion. Recommend ice, motrin, pain meds.     Richardean Canalavid H Yao, MD 07/03/14 [phone]

## 2014-07-03 NOTE — ED Notes (Signed)
MD at bedside. 

## 2014-07-03 NOTE — ED Notes (Signed)
Patient transported to X-ray 

## 2014-08-12 ENCOUNTER — Emergency Department (HOSPITAL_BASED_OUTPATIENT_CLINIC_OR_DEPARTMENT_OTHER): Payer: Worker's Compensation

## 2014-08-12 ENCOUNTER — Emergency Department (HOSPITAL_BASED_OUTPATIENT_CLINIC_OR_DEPARTMENT_OTHER)
Admission: EM | Admit: 2014-08-12 | Discharge: 2014-08-12 | Disposition: A | Discharge status: Home / Self Care | Payer: Worker's Compensation | Attending: Emergency Medicine | Admitting: Emergency Medicine

## 2014-08-12 ENCOUNTER — Encounter (HOSPITAL_BASED_OUTPATIENT_CLINIC_OR_DEPARTMENT_OTHER): Payer: Self-pay | Admitting: *Deleted

## 2014-08-12 DIAGNOSIS — Z91040 Latex allergy status: Secondary | ICD-10-CM | POA: Insufficient documentation

## 2014-08-12 DIAGNOSIS — Y9389 Activity, other specified: Secondary | ICD-10-CM | POA: Insufficient documentation

## 2014-08-12 DIAGNOSIS — M1990 Unspecified osteoarthritis, unspecified site: Secondary | ICD-10-CM | POA: Insufficient documentation

## 2014-08-12 DIAGNOSIS — Y998 Other external cause status: Secondary | ICD-10-CM | POA: Diagnosis not present

## 2014-08-12 DIAGNOSIS — Y9289 Other specified places as the place of occurrence of the external cause: Secondary | ICD-10-CM | POA: Diagnosis not present

## 2014-08-12 DIAGNOSIS — S93601A Unspecified sprain of right foot, initial encounter: Secondary | ICD-10-CM | POA: Diagnosis not present

## 2014-08-12 DIAGNOSIS — J45909 Unspecified asthma, uncomplicated: Secondary | ICD-10-CM | POA: Insufficient documentation

## 2014-08-12 DIAGNOSIS — I10 Essential (primary) hypertension: Secondary | ICD-10-CM | POA: Insufficient documentation

## 2014-08-12 DIAGNOSIS — Z8639 Personal history of other endocrine, nutritional and metabolic disease: Secondary | ICD-10-CM | POA: Diagnosis not present

## 2014-08-12 DIAGNOSIS — Z79899 Other long term (current) drug therapy: Secondary | ICD-10-CM | POA: Insufficient documentation

## 2014-08-12 DIAGNOSIS — S99921A Unspecified injury of right foot, initial encounter: Secondary | ICD-10-CM | POA: Diagnosis present

## 2014-08-12 DIAGNOSIS — R011 Cardiac murmur, unspecified: Secondary | ICD-10-CM | POA: Insufficient documentation

## 2014-08-12 DIAGNOSIS — W208XXA Other cause of strike by thrown, projected or falling object, initial encounter: Secondary | ICD-10-CM | POA: Insufficient documentation

## 2014-08-12 DIAGNOSIS — K219 Gastro-esophageal reflux disease without esophagitis: Secondary | ICD-10-CM | POA: Insufficient documentation

## 2014-08-12 MED ORDER — IBUPROFEN 800 MG PO TABS
800.0000 mg | ORAL_TABLET | Freq: Once | ORAL | Status: AC
  Administered 2014-08-12: 800 mg via ORAL
  Filled 2014-08-12: qty 1

## 2014-08-12 MED ORDER — IBUPROFEN 600 MG PO TABS: 600.0000 mg | ORAL_TABLET | Freq: Four times a day (QID) | ORAL | Status: DC | PRN

## 2014-08-12 MED ORDER — OXYCODONE-ACETAMINOPHEN 5-325 MG PO TABS: 1.0000 | ORAL_TABLET | Freq: Once | ORAL | Status: DC

## 2014-08-12 NOTE — ED Notes (Signed)
Pt left the ER prior to receiving her ordered med.

## 2014-08-12 NOTE — Discharge Instructions (Signed)
Foot Sprain The muscles and cord like structures which attach muscle to bone (tendons) that surround the feet are made up of units. A foot sprain can occur at the weakest spot in any of these units. This condition is most often caused by injury to or overuse of the foot, as from playing contact sports, or aggravating a previous injury, or from poor conditioning, or obesity. SYMPTOMS  Pain with movement of the foot.  Tenderness and swelling at the injury site.  Loss of strength is present in moderate or severe sprains. THE THREE GRADES OR SEVERITY OF FOOT SPRAIN ARE:  Mild (Grade I): Slightly pulled muscle without tearing of muscle or tendon fibers or loss of strength.  Moderate (Grade II): Tearing of fibers in a muscle, tendon, or at the attachment to bone, with small decrease in strength.  Severe (Grade III): Rupture of the muscle-tendon-bone attachment, with separation of fibers. Severe sprain requires surgical repair. Often repeating (chronic) sprains are caused by overuse. Sudden (acute) sprains are caused by direct injury or over-use. DIAGNOSIS  Diagnosis of this condition is usually by your own observation. If problems continue, a caregiver may be required for further evaluation and treatment. X-rays may be required to make sure there are not breaks in the bones (fractures) present. Continued problems may require physical therapy for treatment. PREVENTION  Use strength and conditioning exercises appropriate for your sport.  Warm up properly prior to working out.  Use athletic shoes that are made for the sport you are participating in.  Allow adequate time for healing. Early return to activities makes repeat injury more likely, and can lead to an unstable arthritic foot that can result in prolonged disability. Mild sprains generally heal in 3 to 10 days, with moderate and severe sprains taking 2 to 10 weeks. Your caregiver can help you determine the proper time required for  healing. HOME CARE INSTRUCTIONS   Apply ice to the injury for 15-20 minutes, 03-04 times per day. Put the ice in a plastic bag and place a towel between the bag of ice and your skin.  An elastic wrap (like an Ace bandage) may be used to keep swelling down.  Keep foot above the level of the heart, or at least raised on a footstool, when swelling and pain are present.  Try to avoid use other than gentle range of motion while the foot is painful. Do not resume use until instructed by your caregiver. Then begin use gradually, not increasing use to the point of pain. If pain does develop, decrease use and continue the above measures, gradually increasing activities that do not cause discomfort, until you gradually achieve normal use.  Use crutches if and as instructed, and for the length of time instructed.  Keep injured foot and ankle wrapped between treatments.  Massage foot and ankle for comfort and to keep swelling down. Massage from the toes up towards the knee.  Only take over-the-counter or prescription medicines for pain, discomfort, or fever as directed by your caregiver. SEEK IMMEDIATE MEDICAL CARE IF:   Your pain and swelling increase, or pain is not controlled with medications.  You have loss of feeling in your foot or your foot turns cold or blue.  You develop new, unexplained symptoms, or an increase of the symptoms that brought you to your caregiver. MAKE SURE YOU:   Understand these instructions.  Will watch your condition.  Will get help right away if you are not doing well or get worse. Document Released:   07/24/2001 Document Revised: 04/26/2011 Document Reviewed: 09/21/2007 Exit[employer] Patient Information 2015 [employer]. This information is not intended to replace advice given to you by your health care provider. Make sure you discuss any questions you have with your health care provider.  

## 2014-08-12 NOTE — ED Notes (Signed)
Pt c/o right foot injury x 10 hrs ago  

## 2014-08-12 NOTE — ED Provider Notes (Signed)
CSN: [id number]     Arrival date & time 08/12/14  1730 History  This chart was scribed for  Zadie Rhine, MD by Bethel Born, ED Scribe. This patient was seen in room MH09/MH09 and the patient's care was started at 6:01 PM.   Chief Complaint  Patient presents with  . Foot Injury    Patient is a 49 y.o. female presenting with foot injury. The history is provided by the patient. No language interpreter was used.  Foot Injury Location:  Foot Time since incident:  10 hours Injury: yes   Mechanism of injury: crush   Crush injury:    Mechanism:  Falling object Foot location:  R foot Pain details:    Radiates to:  Does not radiate   Severity:  Severe   Onset quality:  Sudden   Duration:  10 hours   Timing:  Constant   Progression:  Worsening Chronicity:  New Dislocation: no   Foreign body present:  No foreign bodies Prior injury to area:  No Relieved by:  Nothing Associated symptoms: no back pain and no neck pain   Sara Francis is a 49 y.o. female who presents to the Emergency Department complaining of constant right foot pain with sudden onset 10 hours ago after the tray from a high chair fell on her foot. She rates the pain 10/10 in severity. The pain is exacerbated by walking and she notes having to "hop" to get around. Associated symptoms include swelling in the right foot. Pt denies other injury including neck or back pain.   Past Medical History  Diagnosis Date  . Hyperlipemia   . GERD (gastroesophageal reflux disease)     used to take nexium  . Arthritis   . Wears glasses   . Asthma   . Complication of anesthesia     hard to wake up  . Heart murmur   . Hypertension     under control with med., has been on med. x "a while"    Past Surgical History  Procedure Laterality Date  . Abdominal hysterectomy    . Tonsillectomy    . Tubal ligation    . Knee arthroscopy Right 05/15/2012    Procedure: RIGHT KNEE ARTHROSCOPY  and CHONDROPLASTY;  Surgeon: Nestor Lewandowsky,  MD;  Location: [facility];  Service: Orthopedics;  Laterality: Right;   History reviewed. No pertinent family history. History  Substance Use Topics  . Smoking status: Never Smoker   . Smokeless tobacco: Not on file  . Alcohol Use: No   OB History    No data available     Review of Systems  Musculoskeletal: Negative for back pain and neck pain.       Right foot pain and swelling  All other systems reviewed and are negative.     Allergies  Benadryl; Betadine; Contrast media; and Latex  Home Medications   Prior to Admission medications   Medication Sig Start Date End Date Taking? Authorizing Provider  albuterol (PROVENTIL HFA;VENTOLIN HFA) 108 (90 BASE) MCG/ACT inhaler Inhale 2 puffs into the lungs every 6 (six) hours as needed for wheezing.    Historical Provider, MD  amLODipine (NORVASC) 5 MG tablet Take 5 mg by mouth.    Historical Provider, MD  ibuprofen (ADVIL,MOTRIN) 600 MG tablet Take 1 tablet (600 mg total) by mouth every 6 (six) hours as needed. 08/12/14   Zadie Rhine, MD  pantoprazole (PROTONIX) 40 MG tablet Take 40 mg by mouth daily.  Historical Provider, MD   Triage Vitals:BP 169/89 mmHg  Pulse 83  Temp(Src) 98.2 F (36.8 C) (Oral)  Resp 18  Ht  (1.702 m)  Wt 190 lb (86.183 kg)  BMI 29.75 kg/m2  SpO2 100% Physical Exam CONSTITUTIONAL: Well developed/well nourished HEAD: Normocephalic/atraumatic EYES: EOMI/PERRL ENMT: Mucous membranes moist NECK: supple no meningeal signs SPINE/BACK:entire spine nontender CV: S1/S2 noted, no murmurs/rubs/gallops noted LUNGS: Lungs are clear to auscultation bilaterally, no apparent distress ABDOMEN: soft, nontender, no rebound or guarding, bowel sounds noted throughout abdomen GU:no cva tenderness NEURO: Pt is awake/alert/appropriate, moves all extremitiesx4.  No facial droop.   EXTREMITIES: pulses normal/equal, full ROM, diffuse tenderness to dorsal aspect of right foot, no ankle or tibial  tenderness noted SKIN: warm, color normal PSYCH: no abnormalities of mood noted, alert and oriented to situation  ED Course  Procedures  DIAGNOSTIC STUDIES: Oxygen Saturation is 100% on RA, normal by my interpretation.    COORDINATION OF CARE: 6:01 PM Discussed treatment plan which includes right foot XR, application of ice, and ibuprofen with pt at bedside and pt agreed to plan. 6:22 PM Xray negative Pt has crutches at home advised to use for next week Post op shoe ordered  Imaging Review Dg Foot Complete Right  08/12/2014   CLINICAL DATA:  A chair fell on the patient's right foot today. Pain across the dorsum of the foot. Initial encounter.  EXAM: RIGHT FOOT COMPLETE - 3+ VIEW  COMPARISON:  None.  FINDINGS: No acute bony or joint abnormality is identified. Calcaneal spurring is noted. Soft tissues are unremarkable.  IMPRESSION: No acute abnormality.   Electronically Signed   By: Drusilla Kanner M.D.   On: 08/12/2014 18:03     EKG Interpretation None      MDM   Final diagnoses:  Sprain of right foot, initial encounter    Nursing notes including past medical history and social history reviewed and considered in documentation xrays/imaging reviewed by myself and considered during evaluation  I personally performed the services described in this documentation, which was scribed in my presence. The recorded information has been reviewed and is accurate.      Zadie Rhine, MD 08/12/14 Rickey Primus

## 2014-10-05 ENCOUNTER — Encounter (HOSPITAL_BASED_OUTPATIENT_CLINIC_OR_DEPARTMENT_OTHER): Payer: Self-pay | Admitting: Emergency Medicine

## 2014-10-05 ENCOUNTER — Emergency Department (HOSPITAL_BASED_OUTPATIENT_CLINIC_OR_DEPARTMENT_OTHER)
Admission: EM | Admit: 2014-10-05 | Discharge: 2014-10-05 | Disposition: A | Discharge status: Home / Self Care | Payer: Self-pay | Attending: Emergency Medicine | Admitting: Emergency Medicine

## 2014-10-05 DIAGNOSIS — I10 Essential (primary) hypertension: Secondary | ICD-10-CM | POA: Insufficient documentation

## 2014-10-05 DIAGNOSIS — Z79899 Other long term (current) drug therapy: Secondary | ICD-10-CM | POA: Insufficient documentation

## 2014-10-05 DIAGNOSIS — S3992XA Unspecified injury of lower back, initial encounter: Secondary | ICD-10-CM | POA: Insufficient documentation

## 2014-10-05 DIAGNOSIS — R011 Cardiac murmur, unspecified: Secondary | ICD-10-CM | POA: Insufficient documentation

## 2014-10-05 DIAGNOSIS — M545 Low back pain, unspecified: Secondary | ICD-10-CM

## 2014-10-05 DIAGNOSIS — S199XXA Unspecified injury of neck, initial encounter: Secondary | ICD-10-CM | POA: Insufficient documentation

## 2014-10-05 DIAGNOSIS — M1990 Unspecified osteoarthritis, unspecified site: Secondary | ICD-10-CM | POA: Insufficient documentation

## 2014-10-05 DIAGNOSIS — Y998 Other external cause status: Secondary | ICD-10-CM | POA: Insufficient documentation

## 2014-10-05 DIAGNOSIS — M542 Cervicalgia: Secondary | ICD-10-CM

## 2014-10-05 DIAGNOSIS — Y9389 Activity, other specified: Secondary | ICD-10-CM | POA: Insufficient documentation

## 2014-10-05 DIAGNOSIS — K219 Gastro-esophageal reflux disease without esophagitis: Secondary | ICD-10-CM | POA: Insufficient documentation

## 2014-10-05 DIAGNOSIS — Z973 Presence of spectacles and contact lenses: Secondary | ICD-10-CM | POA: Insufficient documentation

## 2014-10-05 DIAGNOSIS — Z8639 Personal history of other endocrine, nutritional and metabolic disease: Secondary | ICD-10-CM | POA: Insufficient documentation

## 2014-10-05 DIAGNOSIS — Z91040 Latex allergy status: Secondary | ICD-10-CM | POA: Insufficient documentation

## 2014-10-05 DIAGNOSIS — Y92481 Parking lot as the place of occurrence of the external cause: Secondary | ICD-10-CM | POA: Insufficient documentation

## 2014-10-05 DIAGNOSIS — J45909 Unspecified asthma, uncomplicated: Secondary | ICD-10-CM | POA: Insufficient documentation

## 2014-10-05 MED ORDER — CYCLOBENZAPRINE HCL 10 MG PO TABS: 10.0000 mg | ORAL_TABLET | Freq: Two times a day (BID) | ORAL | Status: DC | PRN
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
## 2014-10-05 NOTE — Discharge Instructions (Signed)
Please use medication only as directed, please use Tylenol or ibuprofen as needed for pain. Please follow-up with her primary care provider for further evaluation and management of her hypertension. He is return immediately if new worsening signs or symptoms present.

## 2015-09-10 ENCOUNTER — Encounter (HOSPITAL_BASED_OUTPATIENT_CLINIC_OR_DEPARTMENT_OTHER): Payer: Self-pay | Admitting: *Deleted

## 2015-09-10 ENCOUNTER — Emergency Department (HOSPITAL_BASED_OUTPATIENT_CLINIC_OR_DEPARTMENT_OTHER): Payer: Self-pay

## 2015-09-10 ENCOUNTER — Emergency Department (HOSPITAL_BASED_OUTPATIENT_CLINIC_OR_DEPARTMENT_OTHER)
Admission: EM | Admit: 2015-09-10 | Discharge: 2015-09-10 | Disposition: A | Discharge status: Home / Self Care | Payer: Self-pay | Attending: Emergency Medicine | Admitting: Emergency Medicine

## 2015-09-10 DIAGNOSIS — J45909 Unspecified asthma, uncomplicated: Secondary | ICD-10-CM | POA: Insufficient documentation

## 2015-09-10 DIAGNOSIS — M25511 Pain in right shoulder: Secondary | ICD-10-CM | POA: Insufficient documentation

## 2015-09-10 DIAGNOSIS — Y999 Unspecified external cause status: Secondary | ICD-10-CM | POA: Insufficient documentation

## 2015-09-10 DIAGNOSIS — W19XXXA Unspecified fall, initial encounter: Secondary | ICD-10-CM

## 2015-09-10 DIAGNOSIS — I10 Essential (primary) hypertension: Secondary | ICD-10-CM | POA: Insufficient documentation

## 2015-09-10 DIAGNOSIS — W010XXA Fall on same level from slipping, tripping and stumbling without subsequent striking against object, initial encounter: Secondary | ICD-10-CM | POA: Insufficient documentation

## 2015-09-10 DIAGNOSIS — S82831A Other fracture of upper and lower end of right fibula, initial encounter for closed fracture: Secondary | ICD-10-CM | POA: Insufficient documentation

## 2015-09-10 DIAGNOSIS — Y939 Activity, unspecified: Secondary | ICD-10-CM | POA: Insufficient documentation

## 2015-09-10 DIAGNOSIS — Y92009 Unspecified place in unspecified non-institutional (private) residence as the place of occurrence of the external cause: Secondary | ICD-10-CM | POA: Insufficient documentation

## 2015-09-10 DIAGNOSIS — E785 Hyperlipidemia, unspecified: Secondary | ICD-10-CM | POA: Insufficient documentation

## 2015-09-10 DIAGNOSIS — M1990 Unspecified osteoarthritis, unspecified site: Secondary | ICD-10-CM | POA: Insufficient documentation

## 2015-09-10 DIAGNOSIS — S82401A Unspecified fracture of shaft of right fibula, initial encounter for closed fracture: Secondary | ICD-10-CM

## 2015-09-10 MED ORDER — IBUPROFEN 600 MG PO TABS: 600.0000 mg | ORAL_TABLET | Freq: Three times a day (TID) | ORAL | 0 refills | Status: AC | PRN

## 2015-09-10 MED ORDER — IBUPROFEN 400 MG PO TABS
600.0000 mg | ORAL_TABLET | Freq: Once | ORAL | Status: AC
  Administered 2015-09-10: 600 mg via ORAL
  Filled 2015-09-10: qty 1

## 2015-09-10 MED ORDER — METHOCARBAMOL 500 MG PO TABS: 500.0000 mg | ORAL_TABLET | Freq: Four times a day (QID) | ORAL | 0 refills | Status: DC | PRN

## 2015-09-10 MED FILL — METHOCARBAMOL 500 MG TABLET: 500 | 2 days supply | Qty: 20 | Fill #0

## 2015-09-10 NOTE — ED Notes (Signed)
CMS intact before and after. Pt tolerated well. All questions answered.  

## 2015-09-10 NOTE — ED Triage Notes (Signed)
Pt c/o fall from standing landing on tile floor injuring left arm and left knee x 1 hr ago

## 2015-09-10 NOTE — Discharge Instructions (Signed)
Read the information below.  Use the prescribed medication as directed.  Please discuss all new medications with your pharmacist.  You may return to the Emergency Department at any time for worsening condition or any new symptoms that concern you.   If you develop uncontrolled pain, weakness or numbness of the extremity, severe discoloration of the skin, or you are unable to move your right arm or bear weight on your right leg, return to the ER for a recheck.

## 2015-09-10 NOTE — ED Provider Notes (Signed)
[facility]   CSN: [id number] Arrival date & time: 09/10/15  1423  First Provider Contact:  First MD Initiated Contact with Patient 09/10/15 1501        History   Chief Complaint Chief Complaint  Patient presents with  . Fall    HPI Sara Francis is a 50 y.o. female.  HPI   Patient presents with pain in her right shoulder and right knee that began after she accidentally fell a few hours ago.  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  Reports throbbing pain in her anterior knee where she landed and pain and stiffness in her right shoulder.  The pain is worse with movement.  She denies hitting had or LOC.  Denies any other injury.  Reports she is able to walk, walked into ED.   Hx remote surgery on right knee.     Past Medical History:  Diagnosis Date  . Arthritis   . Asthma   . Complication of anesthesia    hard to wake up  . GERD (gastroesophageal reflux disease)    used to take nexium  . Heart murmur   . Hyperlipemia   . Hypertension    under control with med., has been on med. x "a while"   . Wears glasses     There are no active problems to display for this patient.   Past Surgical History:  Procedure Laterality Date  . ABDOMINAL HYSTERECTOMY    . KNEE ARTHROSCOPY Right 05/15/2012   Procedure: RIGHT KNEE ARTHROSCOPY  and CHONDROPLASTY;  Surgeon: Nestor Lewandowsky, MD;  Location: [facility];  Service: Orthopedics;  Laterality: Right;  . TONSILLECTOMY    . TUBAL LIGATION      OB History    No data available       Home Medications    Prior to Admission medications   Medication Sig Start Date End Date Taking? Authorizing Provider  albuterol (PROVENTIL HFA;VENTOLIN HFA) 108 (90 BASE) MCG/ACT inhaler Inhale 2 puffs into the lungs every 6 (six) hours as needed for wheezing.    Historical Provider, MD  amLODipine  (NORVASC) 5 MG tablet Take 5 mg by mouth.    Historical Provider, MD  cyclobenzaprine (FLEXERIL) 10 MG tablet Take 1 tablet (10 mg total) by mouth 2 (two) times daily as needed for muscle spasms. 10/05/14   Eyvonne Mechanic, PA-C  fluticasone-salmeterol (ADVAIR HFA) 353-29 MCG/ACT inhaler Inhale 2 puffs into the lungs as needed.    Historical Provider, MD  ibuprofen (ADVIL,MOTRIN) 600 MG tablet Take 1 tablet (600 mg total) by mouth every 6 (six) hours as needed. 08/12/14   Zadie Rhine, MD  pantoprazole (PROTONIX) 40 MG tablet Take 40 mg by mouth daily.    Historical Provider, MD    Family History No family history on file.  Social History Social History  Substance Use Topics  . Smoking status: Never Smoker  . Smokeless tobacco: Not on file  . Alcohol use No     Allergies   Benadryl [diphenhydramine hcl]; Betadine [povidone iodine]; Contrast media [iodinated diagnostic agents]; and Latex   Review of Systems Review of Systems  Constitutional: Negative for activity change and fever.  HENT: Negative for facial swelling.   Cardiovascular: Negative for chest pain.  Gastrointestinal: Negative for abdominal pain.  Musculoskeletal: Positive for arthralgias. Negative for back pain and neck pain.  Skin: Negative for wound.  Allergic/Immunologic: Negative for immunocompromised state.  Neurological: Negative for syncope, weakness and numbness.  Hematological: Does not bruise/bleed easily.  Psychiatric/Behavioral: Negative for self-injury.     Physical Exam Updated Vital Signs BP 147/91   Pulse 71   Temp 99.5 F (37.5 C) (Oral)   Resp 16   Ht  (1.702 m)   Wt 86.2 kg   SpO2 100%   BMI 29.76 kg/m   Physical Exam  Constitutional: She appears well-developed and well-nourished. No distress.  HENT:  Head: Normocephalic and atraumatic.  Neck: Neck supple.  Pulmonary/Chest: Effort normal.  Musculoskeletal:       Right shoulder: She exhibits decreased range of motion,  tenderness and bony tenderness. She exhibits no swelling, no effusion, no crepitus, no deformity, no laceration, normal pulse and normal strength.       Right knee: She exhibits normal range of motion, no swelling, no effusion, no ecchymosis, no deformity, no laceration and no erythema. Tenderness found.       Legs: Tenderness over anterior right knee and diffusely, particularly superior right shoulder.  No other focal bony tenderness throughout exam including all extremities and spine.  Distal pulses and sensation intact.  Compartments soft.    Neurological: She is alert.  Skin: She is not diaphoretic.  Nursing note and vitals reviewed.    ED Treatments / Results  Labs (all labs ordered are listed, but only abnormal results are displayed) Labs Reviewed - No data to display  EKG  EKG Interpretation None       Radiology Dg Shoulder Right  Result Date: 09/10/2015 CLINICAL DATA:  Status post fall landing on the right side. Superior shoulder pain. EXAM: RIGHT SHOULDER - 2+ VIEW COMPARISON:  None. FINDINGS: There is no fracture or dislocation. There are mild degenerative changes of the acromioclavicular joint. IMPRESSION: No acute osseous injury of the right shoulder. Electronically Signed   By: Elige Ko   On: 09/10/2015 15:32  Dg Knee Complete 4 Views Right  Result Date: 09/10/2015 CLINICAL DATA:  Status post fall. EXAM: RIGHT KNEE - COMPLETE 4+ VIEW COMPARISON:  None. FINDINGS: Subtle lucency on the lateral view involving the left fibular head which may reflect a nondisplaced fracture versus artifact. Otherwise no fracture or dislocation. Tricompartmental osteoarthritis most severe in the medial femorotibial compartment. No significant joint effusion. No aggressive lytic or sclerotic osseous lesion. IMPRESSION: Tricompartmental osteoarthritis of the right knee. Possible nondisplaced fracture of the right fibular head. Correlate with point tenderness. Electronically Signed   By: Elige Ko   On: 09/10/2015 15:37   Procedures Procedures (including critical care time)  Medications Ordered in ED Medications  ibuprofen (ADVIL,MOTRIN) tablet 600 mg (600 mg Oral Given 09/10/15 1513)     Initial Impression / Assessment and Plan / ED Course  I have reviewed the triage vital signs and the nursing notes.  Pertinent labs & imaging results that were available during my care of the patient were reviewed by me and considered in my medical decision making (see chart for details).  Clinical Course    Afebrile, nontoxic patient with injury to her right shoulder and right knee in mechanical fall at construction site.  Neurovascularly intact.   Xray demonstrates nondisplaced fibular head fracture - pt does have mild tenderness at this site. Shoulder xray negative.  D/C home with sling, knee immobilizer, orthopedic (Dr Pearletha Forge) follow up.  Discussed result, findings, treatment, and follow up  with patient.  Pt given return precautions.  Pt verbalizes understanding and agrees with plan.      Final Clinical Impressions(s) / ED Diagnoses   Final diagnoses:  Fall, initial encounter  Fibula fracture, right, closed, initial encounter  Right shoulder pain    New Prescriptions New Prescriptions   IBUPROFEN (ADVIL,MOTRIN) 600 MG TABLET    Take 1 tablet (600 mg total) by mouth every 8 (eight) hours as needed.   METHOCARBAMOL (ROBAXIN) 500 MG TABLET    Take 1-2 tablets (500-1,000 mg total) by mouth every 6 (six) hours as needed for muscle spasms.     Trixie Dredge, PA-C 09/10/15 1641    Jacalyn Lefevre, MD 09/12/15 [phone]

## 2015-09-10 NOTE — ED Notes (Signed)
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  Complaining of right shoulder pain and right knee pain. Ambulated to room with steady gait.  No medications taken prior to arrival.

## 2015-09-10 NOTE — ED Notes (Signed)
PA at bedside.

## 2015-09-10 NOTE — ED Notes (Signed)
Pt given d/c instructions. Rx x 2. Verbalizes understanding. No questions. 

## 2015-09-10 NOTE — ED Notes (Signed)
Returned from xray

## 2016-01-03 ENCOUNTER — Emergency Department (HOSPITAL_BASED_OUTPATIENT_CLINIC_OR_DEPARTMENT_OTHER)
Admission: EM | Admit: 2016-01-03 | Discharge: 2016-01-03 | Disposition: A | Discharge status: Home / Self Care | Payer: Self-pay | Attending: Emergency Medicine | Admitting: Emergency Medicine

## 2016-01-03 ENCOUNTER — Encounter (HOSPITAL_BASED_OUTPATIENT_CLINIC_OR_DEPARTMENT_OTHER): Payer: Self-pay | Admitting: *Deleted

## 2016-01-03 ENCOUNTER — Emergency Department (HOSPITAL_BASED_OUTPATIENT_CLINIC_OR_DEPARTMENT_OTHER): Payer: Self-pay

## 2016-01-03 DIAGNOSIS — Y939 Activity, unspecified: Secondary | ICD-10-CM | POA: Insufficient documentation

## 2016-01-03 DIAGNOSIS — J45909 Unspecified asthma, uncomplicated: Secondary | ICD-10-CM | POA: Insufficient documentation

## 2016-01-03 DIAGNOSIS — W109XXA Fall (on) (from) unspecified stairs and steps, initial encounter: Secondary | ICD-10-CM | POA: Insufficient documentation

## 2016-01-03 DIAGNOSIS — Y929 Unspecified place or not applicable: Secondary | ICD-10-CM | POA: Insufficient documentation

## 2016-01-03 DIAGNOSIS — Z79899 Other long term (current) drug therapy: Secondary | ICD-10-CM | POA: Insufficient documentation

## 2016-01-03 DIAGNOSIS — I10 Essential (primary) hypertension: Secondary | ICD-10-CM | POA: Insufficient documentation

## 2016-01-03 DIAGNOSIS — Z87891 Personal history of nicotine dependence: Secondary | ICD-10-CM | POA: Insufficient documentation

## 2016-01-03 DIAGNOSIS — S4991XA Unspecified injury of right shoulder and upper arm, initial encounter: Secondary | ICD-10-CM | POA: Insufficient documentation

## 2016-01-03 DIAGNOSIS — Y999 Unspecified external cause status: Secondary | ICD-10-CM | POA: Insufficient documentation

## 2016-01-03 DIAGNOSIS — W19XXXA Unspecified fall, initial encounter: Secondary | ICD-10-CM

## 2016-01-03 MED ORDER — NAPROXEN 500 MG PO TABS: 500.0000 mg | ORAL_TABLET | Freq: Two times a day (BID) | ORAL | 0 refills | Status: DC

## 2016-01-03 MED ORDER — METHOCARBAMOL 500 MG PO TABS: 500.0000 mg | ORAL_TABLET | Freq: Two times a day (BID) | ORAL | 0 refills | Status: DC

## 2016-01-03 MED ORDER — KETOROLAC TROMETHAMINE 60 MG/2ML IM SOLN
60.0000 mg | Freq: Once | INTRAMUSCULAR | Status: AC
  Administered 2016-01-03: 60 mg via INTRAMUSCULAR
  Filled 2016-01-03: qty 2

## 2016-01-03 NOTE — ED Notes (Signed)
Patient transported to X-ray 

## 2016-01-03 NOTE — Discharge Instructions (Signed)
You have been seen today for injuries following a fall. Your imaging showed no abnormalities. Follow up with orthopedics should symptoms continue. Call the number provided to set up an appointment. Ibuprofen or naproxen to reduce pain and inflammation. Use the shoulder sling as needed for comfort. Perform the shoulder exercises as best as you can to avoid a condition called adhesive capsulitis, or frozen shoulder.

## 2016-01-03 NOTE — ED Provider Notes (Signed)
[facility]   CSN: [id number] Arrival date & time: 01/03/16  1100     History   Chief Complaint Chief Complaint  Patient presents with  . Fall    HPI Sara SeatsYvette Francis is a 50 y.o. female.  HPI   Sara Francis is a 50 y.o. female, with a history of Arthritis, presenting to the ED with injuries from a fall that occurred 5 days ago. Patient states she tripped down the stairs. Complains of right shoulder, left wrist and left ankle pain. Requests x-rays of these joints. Patient has been weightbearing on the ankle and has been using the wrist since the incident. Has been taking ibuprofen with minimal relief. States her shoulder pain is the worst, states it is moderate to severe, aching, nonradiating. Denies neuro deficits, head injury, neck/back pain, or any other complaints.     Past Medical History:  Diagnosis Date  . Arthritis   . Asthma   . Complication of anesthesia    hard to wake up  . GERD (gastroesophageal reflux disease)    used to take nexium  . Heart murmur   . Hyperlipemia   . Hypertension    under control with med., has been on med. x "a while"   . Wears glasses     There are no active problems to display for this patient.   Past Surgical History:  Procedure Laterality Date  . ABDOMINAL HYSTERECTOMY    . KNEE ARTHROSCOPY Right 05/15/2012   Procedure: RIGHT KNEE ARTHROSCOPY  and CHONDROPLASTY;  Surgeon: Nestor LewandowskyFrank J Rowan, MD;  Location: [facility];  Service: Orthopedics;  Laterality: Right;  . TONSILLECTOMY    . TUBAL LIGATION      OB History    No data available       Home Medications    Prior to Admission medications   Medication Sig Start Date End Date Taking? Authorizing Provider  albuterol (PROVENTIL HFA;VENTOLIN HFA) 108 (90 BASE) MCG/ACT inhaler Inhale 2 puffs into the lungs every 6 (six) hours as needed for wheezing.    Historical Provider, MD  amLODipine (NORVASC) 5 MG tablet Take 5 mg by mouth.     Historical Provider, MD  fluticasone-salmeterol (ADVAIR HFA) 115-21 MCG/ACT inhaler Inhale 2 puffs into the lungs as needed.    Historical Provider, MD  ibuprofen (ADVIL,MOTRIN) 600 MG tablet Take 1 tablet (600 mg total) by mouth every 8 (eight) hours as needed. 09/10/15   Trixie DredgeEmily West, PA-C  methocarbamol (ROBAXIN) 500 MG tablet Take 1-2 tablets (500-1,000 mg total) by mouth every 6 (six) hours as needed for muscle spasms. 09/10/15   Trixie DredgeEmily West, PA-C  methocarbamol (ROBAXIN) 500 MG tablet Take 1 tablet (500 mg total) by mouth 2 (two) times daily. 01/03/16   Munira Polson C Iren Whipp, PA-C  naproxen (NAPROSYN) 500 MG tablet Take 1 tablet (500 mg total) by mouth 2 (two) times daily. 01/03/16   Olof Marcil C Mizani Dilday, PA-C  pantoprazole (PROTONIX) 40 MG tablet Take 40 mg by mouth daily.    Historical Provider, MD    Family History History reviewed. No pertinent family history.  Social History Social History  Substance Use Topics  . Smoking status: Never Smoker  . Smokeless tobacco: Former NeurosurgeonUser  . Alcohol use No     Allergies   Benadryl [diphenhydramine hcl]; Betadine [povidone iodine]; Contrast media [iodinated diagnostic agents]; and Latex   Review of Systems Review of Systems  Respiratory: Negative for shortness of breath.   Cardiovascular: Negative for chest  pain.  Gastrointestinal: Negative for nausea and vomiting.  Musculoskeletal: Positive for arthralgias and joint swelling. Negative for back pain and neck pain.  Skin: Negative for wound.  Neurological: Negative for weakness and numbness.  All other systems reviewed and are negative.    Physical Exam Updated Vital Signs BP 147/68 (BP Location: Right Arm)   Pulse 101   Temp 98.1 F (36.7 C) (Oral)   Resp 18   Ht 5\' 7"  (1.702 m)   Wt 88.5 kg   SpO2 99%   BMI 30.54 kg/m   Physical Exam  Constitutional: She is oriented to person, place, and time. She appears well-developed and well-nourished. No distress.  HENT:  Head: Normocephalic and  atraumatic.  Eyes: Conjunctivae and EOM are normal. Pupils are equal, round, and reactive to light.  Neck: Normal range of motion. Neck supple.  Cardiovascular: Normal rate, regular rhythm and intact distal pulses.   Pulmonary/Chest: Effort normal. No respiratory distress.  Abdominal: Soft. There is no tenderness. There is no guarding.  Musculoskeletal: She exhibits edema and tenderness.  Tenderness to the anterior right shoulder without obvious deformity or crepitus. Pain limits range of motion in the shoulder. Tenderness and slight swelling to the left wrist. Range of motion intact. Tenderness and slight swelling to the left ankle over the lateral malleolus. Patient is weightbearing.  Neurological: She is alert and oriented to person, place, and time.  No sensory deficits. Strength 5/5 in all extremities. No gait disturbance. Coordination intact. Cranial nerves III-XII grossly intact.   Skin: Skin is warm and dry. Capillary refill takes less than 2 seconds. She is not diaphoretic.  Psychiatric: She has a normal mood and affect. Her behavior is normal.  Nursing note and vitals reviewed.    ED Treatments / Results  Labs (all labs ordered are listed, but only abnormal results are displayed) Labs Reviewed - No data to display  EKG  EKG Interpretation None       Radiology Dg Shoulder Right  Result Date: 01/03/2016 CLINICAL DATA:  Larey Seat down stairs Wednesday.  Right shoulder pain. EXAM: RIGHT SHOULDER - 2+ VIEW COMPARISON:  09/10/2015 FINDINGS: Degenerative spurring and joint space narrowing in the [facility] joint. No acute bony abnormality. Specifically, no fracture, subluxation, or dislocation. Soft tissues are intact. IMPRESSION: No acute bony abnormality. Electronically Signed   By: Charlett Nose M.D.   On: 01/03/2016 12:20   Dg Wrist Complete Left  Result Date: 01/03/2016 CLINICAL DATA:  Fall down stairs.  Left wrist pain. EXAM: LEFT WRIST - COMPLETE 3+ VIEW COMPARISON:  04/17/2014  FINDINGS: There is no evidence of fracture or dislocation. There is no evidence of arthropathy or other focal bone abnormality. Soft tissues are unremarkable. IMPRESSION: Negative. Electronically Signed   By: Charlett Nose M.D.   On: 01/03/2016 12:21   Dg Ankle Complete Left  Result Date: 01/03/2016 CLINICAL DATA:  Larey Seat down stairs Wednesday. Left lateral ankle pain. EXAM: LEFT ANKLE COMPLETE - 3+ VIEW COMPARISON:  None. FINDINGS: There is no evidence of fracture, dislocation, or joint effusion. There is no evidence of arthropathy or other focal bone abnormality. Soft tissues are unremarkable. IMPRESSION: Negative. Electronically Signed   By: Charlett Nose M.D.   On: 01/03/2016 12:20    Procedures Procedures (including critical care time)  Medications Ordered in ED Medications  ketorolac (TORADOL) injection 60 mg (60 mg Intramuscular Given 01/03/16 1140)     Initial Impression / Assessment and Plan / ED Course  I have reviewed the triage vital  signs and the nursing notes.  Pertinent labs & imaging results that were available during my care of the patient were reviewed by me and considered in my medical decision making (see chart for details).  Clinical Course     Patient presents with primarily right shoulder pain after a fall 5 days ago. No abnormalities on x-ray. Sling provided. Orthopedic follow-up. Return precautions discussed.    Vitals:   01/03/16 1108 01/03/16 1248  BP: 147/68 130/80  Pulse: 101 77  Resp: 18 18  Temp: 98.1 F (36.7 C)   TempSrc: Oral   SpO2: 99% 99%  Weight: 88.5 kg   Height: 5\' 7"  (1.702 m)        Final Clinical Impressions(s) / ED Diagnoses   Final diagnoses:  Fall, initial encounter  Injury of right shoulder, initial encounter    New Prescriptions Discharge Medication List as of 01/03/2016 12:33 PM    START taking these medications   Details  !! methocarbamol (ROBAXIN) 500 MG tablet Take 1 tablet (500 mg total) by mouth 2 (two) times  daily., Starting Sat 01/03/2016, Print    naproxen (NAPROSYN) 500 MG tablet Take 1 tablet (500 mg total) by mouth 2 (two) times daily., Starting Sat 01/03/2016, Print     !! - Potential duplicate medications found. Please discuss with provider.       Anselm PancoastShawn C Zephyr Ridley, PA-C 01/04/16 1650    Gwyneth SproutWhitney Plunkett, MD 01/11/16 2042

## 2016-01-03 NOTE — ED Triage Notes (Signed)
Patient states she tripped and fell down the stairs last Wednesday. C/O R shoulder, Left wrist and Left ankle pain. Took ibuprofen, but no relief

## 2016-01-10 ENCOUNTER — Encounter (HOSPITAL_BASED_OUTPATIENT_CLINIC_OR_DEPARTMENT_OTHER): Payer: Self-pay | Admitting: Adult Health

## 2016-01-10 ENCOUNTER — Emergency Department (HOSPITAL_BASED_OUTPATIENT_CLINIC_OR_DEPARTMENT_OTHER)
Admission: EM | Admit: 2016-01-10 | Discharge: 2016-01-10 | Disposition: A | Discharge status: Home / Self Care | Payer: Self-pay | Attending: Emergency Medicine | Admitting: Emergency Medicine

## 2016-01-10 ENCOUNTER — Emergency Department (HOSPITAL_BASED_OUTPATIENT_CLINIC_OR_DEPARTMENT_OTHER): Payer: Self-pay

## 2016-01-10 DIAGNOSIS — S60211A Contusion of right wrist, initial encounter: Secondary | ICD-10-CM | POA: Insufficient documentation

## 2016-01-10 DIAGNOSIS — Z91040 Latex allergy status: Secondary | ICD-10-CM | POA: Insufficient documentation

## 2016-01-10 DIAGNOSIS — S80812A Abrasion, left lower leg, initial encounter: Secondary | ICD-10-CM | POA: Insufficient documentation

## 2016-01-10 DIAGNOSIS — I10 Essential (primary) hypertension: Secondary | ICD-10-CM | POA: Insufficient documentation

## 2016-01-10 DIAGNOSIS — Y92009 Unspecified place in unspecified non-institutional (private) residence as the place of occurrence of the external cause: Secondary | ICD-10-CM | POA: Insufficient documentation

## 2016-01-10 DIAGNOSIS — S80811A Abrasion, right lower leg, initial encounter: Secondary | ICD-10-CM | POA: Insufficient documentation

## 2016-01-10 DIAGNOSIS — Z87891 Personal history of nicotine dependence: Secondary | ICD-10-CM | POA: Insufficient documentation

## 2016-01-10 DIAGNOSIS — W108XXA Fall (on) (from) other stairs and steps, initial encounter: Secondary | ICD-10-CM | POA: Insufficient documentation

## 2016-01-10 DIAGNOSIS — Y999 Unspecified external cause status: Secondary | ICD-10-CM | POA: Insufficient documentation

## 2016-01-10 DIAGNOSIS — J45909 Unspecified asthma, uncomplicated: Secondary | ICD-10-CM | POA: Insufficient documentation

## 2016-01-10 DIAGNOSIS — Y9301 Activity, walking, marching and hiking: Secondary | ICD-10-CM | POA: Insufficient documentation

## 2016-01-10 MED ORDER — NAPROXEN 250 MG PO TABS
500.0000 mg | ORAL_TABLET | Freq: Once | ORAL | Status: AC
  Administered 2016-01-10: 500 mg via ORAL
  Filled 2016-01-10: qty 2

## 2016-01-10 NOTE — Discharge Instructions (Signed)
Please read and follow all provided instructions.  Your diagnoses today include:  1. Contusion of right wrist, initial encounter   2. Abrasion of left lower leg, initial encounter   3. Abrasion of right lower leg, initial encounter     Tests performed today include:  An x-ray of the affected area - does NOT show any broken bones  Vital signs. See below for your results today.   Medications prescribed:   Naproxen - anti-inflammatory pain medication  Do not exceed 500mg  naproxen every 12 hours, take with food  You have been prescribed an anti-inflammatory medication or NSAID. Take with food. Take smallest effective dose for the shortest duration needed for your pain. Stop taking if you experience stomach pain or vomiting.   Take any prescribed medications only as directed.  Home care instructions:   Follow any educational materials contained in this packet  Follow R.I.C.E. Protocol:  R - rest your injury   I  - use ice on injury without applying directly to skin  C - compress injury with bandage or splint  E - elevate the injury as much as possible  Follow-up instructions: Please follow-up with your primary care provider if you continue to have significant pain in 1 week. In this case you may have a more severe injury that requires further care.   Return instructions:   Please return if your fingers are numb or tingling, appear gray or blue, or you have severe pain (also elevate the arm and loosen splint or wrap if you were given one)  Please return to the Emergency Department if you experience worsening symptoms.   Please return if you have any other emergent concerns.  Additional Information:  Your vital signs today were: BP 151/94 (BP Location: Right Arm)    Pulse 85    Temp 98.6 F (37 C) (Oral)    Resp 20    Ht 5\' 7"  (1.702 m)    Wt 88.5 kg    SpO2 100%    BMI 30.54 kg/m  If your blood pressure (BP) was elevated above 135/85 this visit, please have this  repeated by your doctor within one month. --------------

## 2016-01-10 NOTE — ED Notes (Signed)
Pt given d/c instructions as per chart. Verbalizes understanding. No questions. 

## 2016-01-10 NOTE — ED Provider Notes (Signed)
[facility]   CSN: [id number] Arrival date & time: 01/10/16  1608  By signing my name below, I, Emmanuella Mensah, attest that this documentation has been prepared under the direction and in the presence of Renne CriglerJoshua Ellianah Cordy, PA-C. Electronically Signed: Angelene GiovanniEmmanuella Mensah, ED Scribe. 01/10/16. 7:12 PM.   History   Chief Complaint Chief Complaint  Patient presents with  . Fall    HPI Comments: Sara Francis is a 50 y.o. female with a hx of arthritis and hypertension who presents to the Emergency Department complaining of gradually worsening moderate BLE pain with multiple superficial abrasions to the anterior aspects s/p fall that occurred at 2 pm today. She reports associated right wrist pain with swelling. She notes that the wrist pain is worse with ROM. She explains that she was walking down brick steps when she tripped and fell, landing on her outstretched right arm and bilateral knees. She denies any head injuries or LOC. Pt has been able to ambulate since the fall. No alleviating factors noted. Pt has not tried any medications PTA. She has an allergy to Benadryl, Betadine, Contrast media, and Latex. She denies any fever, chills, nausea, vomiting, or any other symptoms.   The history is provided by the patient. No language interpreter was used.    Past Medical History:  Diagnosis Date  . Arthritis   . Asthma   . Complication of anesthesia    hard to wake up  . GERD (gastroesophageal reflux disease)    used to take nexium  . Heart murmur   . Hyperlipemia   . Hypertension    under control with med., has been on med. x "a while"   . Wears glasses     There are no active problems to display for this patient.   Past Surgical History:  Procedure Laterality Date  . ABDOMINAL HYSTERECTOMY    . KNEE ARTHROSCOPY Right 05/15/2012   Procedure: RIGHT KNEE ARTHROSCOPY  and CHONDROPLASTY;  Surgeon: Nestor LewandowskyFrank J Rowan, MD;  Location: [facility];   Service: Orthopedics;  Laterality: Right;  . TONSILLECTOMY    . TUBAL LIGATION      OB History    No data available       Home Medications    Prior to Admission medications   Medication Sig Start Date End Date Taking? Authorizing Provider  albuterol (PROVENTIL HFA;VENTOLIN HFA) 108 (90 BASE) MCG/ACT inhaler Inhale 2 puffs into the lungs every 6 (six) hours as needed for wheezing.    Historical Provider, MD  amLODipine (NORVASC) 5 MG tablet Take 5 mg by mouth.    Historical Provider, MD  fluticasone-salmeterol (ADVAIR HFA) 115-21 MCG/ACT inhaler Inhale 2 puffs into the lungs as needed.    Historical Provider, MD  ibuprofen (ADVIL,MOTRIN) 600 MG tablet Take 1 tablet (600 mg total) by mouth every 8 (eight) hours as needed. 09/10/15   Trixie DredgeEmily West, PA-C  methocarbamol (ROBAXIN) 500 MG tablet Take 1-2 tablets (500-1,000 mg total) by mouth every 6 (six) hours as needed for muscle spasms. 09/10/15   Trixie DredgeEmily West, PA-C  methocarbamol (ROBAXIN) 500 MG tablet Take 1 tablet (500 mg total) by mouth 2 (two) times daily. 01/03/16   Shawn C Joy, PA-C  naproxen (NAPROSYN) 500 MG tablet Take 1 tablet (500 mg total) by mouth 2 (two) times daily. 01/03/16   Shawn C Joy, PA-C  pantoprazole (PROTONIX) 40 MG tablet Take 40 mg by mouth daily.    Historical Provider, MD    Family History  History reviewed. No pertinent family history.  Social History Social History  Substance Use Topics  . Smoking status: Never Smoker  . Smokeless tobacco: Former Neurosurgeon  . Alcohol use No     Allergies   Benadryl [diphenhydramine hcl]; Betadine [povidone iodine]; Contrast media [iodinated diagnostic agents]; and Latex   Review of Systems Review of Systems  Constitutional: Negative for activity change, chills and fever.  Gastrointestinal: Negative for diarrhea and vomiting.  Musculoskeletal: Positive for arthralgias and joint swelling. Negative for back pain and neck pain.  Skin: Positive for wound (superficial  abrasions).  Neurological: Negative for weakness and numbness.     Physical Exam Updated Vital Signs BP 140/93   Pulse 88   Temp 98.6 F (37 C) (Oral)   Resp 18   Ht 5\' 7"  (1.702 m)   Wt 195 lb (88.5 kg)   SpO2 100%   BMI 30.54 kg/m   Physical Exam  Constitutional: She is oriented to person, place, and time. She appears well-developed and well-nourished.  HENT:  Head: Normocephalic and atraumatic.  Eyes: Pupils are equal, round, and reactive to light.  Neck: Normal range of motion. Neck supple.  Cardiovascular: Normal rate and normal pulses.  Exam reveals no decreased pulses.   Pulmonary/Chest: Effort normal.  Musculoskeletal: She exhibits tenderness. She exhibits no edema.       Right elbow: Normal.      Right wrist: She exhibits tenderness (no snuffbox tenderness). She exhibits normal range of motion, no bony tenderness and no swelling.       Right hip: Normal.       Left hip: Normal.       Right knee: Normal.       Left knee: Normal.       Right ankle: Normal.       Left ankle: Normal.       Right forearm: She exhibits tenderness. She exhibits no bony tenderness and no swelling.       Right hand: Normal.       Right lower leg: She exhibits tenderness (minor ecchymosis).       Left lower leg: She exhibits tenderness (minor ecchymosis).  Neurological: She is alert and oriented to person, place, and time. No sensory deficit.  Motor, sensation, and vascular distal to the injury is fully intact.   Skin: Skin is warm and dry.  Psychiatric: She has a normal mood and affect.  Nursing note and vitals reviewed.    ED Treatments / Results  DIAGNOSTIC STUDIES: Oxygen Saturation is 100% on RA, normal by my interpretation.    COORDINATION OF CARE: 6:59 PM- Pt advised of plan for treatment and pt agrees. Pt will receve right wrist x-ray for further evaluation. Will provide resources for Ortho follow up if symptoms persist. Patient provided with wrist splint.  Radiology Dg  Wrist Complete Right  Result Date: 01/10/2016 CLINICAL DATA:  Right wrist pain, fell down steps today at 2 p.m. EXAM: RIGHT WRIST - COMPLETE 3+ VIEW COMPARISON:  07/03/2014 FINDINGS: Four views of the right wrist submitted. No acute fracture or subluxation. Mild negative ulnar variance. IMPRESSION: Negative. Electronically Signed   By: Natasha Mead M.D.   On: 01/10/2016 19:26    Procedures Procedures (including critical care time)  Medications Ordered in ED Medications  naproxen (NAPROSYN) tablet 500 mg (500 mg Oral Given 01/10/16 1906)     Initial Impression / Assessment and Plan / ED Course  Renne Crigler, PA-C has reviewed the triage vital signs and  the nursing notes.  Pertinent labs & imaging results that were available during my care of the patient were reviewed by me and considered in my medical decision making (see chart for details).  Clinical Course    Vital signs reviewed and are as follows: Vitals:   01/10/16 1620 01/10/16 1908  BP: 140/93 151/94  Pulse: 88 85  Resp: 18 20  Temp: 98.6 F (37 C)    7:46 PM Patient was counseled on RICE protocol and told to rest injury, use ice for no longer than 15 minutes every hour, compress the area, and elevate above the level of their heart as much as possible to reduce swelling. Questions answered. Patient verbalized understanding.     Final Clinical Impressions(s) / ED Diagnoses   Final diagnoses:  Contusion of right wrist, initial encounter  Abrasion of left lower leg, initial encounter  Abrasion of right lower leg, initial encounter   Patient with minor injuries after fall today. Upper extremity and lower extremities are neurovascularly intact. Imaging is negative. Rice protocol is indicated.   New Prescriptions New Prescriptions   No medications on file   I personally performed the services described in this documentation, which was scribed in my presence. The recorded information has been reviewed and is  accurate.    Renne CriglerJoshua Okie Bogacz, PA-C 01/10/16 1947    Arby BarretteMarcy Pfeiffer, MD 01/11/16 [phone]

## 2016-01-10 NOTE — ED Triage Notes (Signed)
REports fall on the same brick steps at her home and scraped her right leg, superficial abraisions noted. denis head or back injury, ambulatory.

## 2016-03-15 ENCOUNTER — Emergency Department (HOSPITAL_BASED_OUTPATIENT_CLINIC_OR_DEPARTMENT_OTHER)
Admission: EM | Admit: 2016-03-15 | Discharge: 2016-03-15 | Disposition: A | Discharge status: Home / Self Care | Payer: Self-pay | Attending: Emergency Medicine | Admitting: Emergency Medicine

## 2016-03-15 ENCOUNTER — Encounter (HOSPITAL_BASED_OUTPATIENT_CLINIC_OR_DEPARTMENT_OTHER): Payer: Self-pay | Admitting: *Deleted

## 2016-03-15 ENCOUNTER — Emergency Department (HOSPITAL_BASED_OUTPATIENT_CLINIC_OR_DEPARTMENT_OTHER): Payer: Self-pay

## 2016-03-15 DIAGNOSIS — Y999 Unspecified external cause status: Secondary | ICD-10-CM | POA: Insufficient documentation

## 2016-03-15 DIAGNOSIS — S90121A Contusion of right lesser toe(s) without damage to nail, initial encounter: Secondary | ICD-10-CM | POA: Insufficient documentation

## 2016-03-15 DIAGNOSIS — I10 Essential (primary) hypertension: Secondary | ICD-10-CM | POA: Insufficient documentation

## 2016-03-15 DIAGNOSIS — Y939 Activity, unspecified: Secondary | ICD-10-CM | POA: Insufficient documentation

## 2016-03-15 DIAGNOSIS — J45909 Unspecified asthma, uncomplicated: Secondary | ICD-10-CM | POA: Insufficient documentation

## 2016-03-15 DIAGNOSIS — W208XXA Other cause of strike by thrown, projected or falling object, initial encounter: Secondary | ICD-10-CM | POA: Insufficient documentation

## 2016-03-15 DIAGNOSIS — Z79899 Other long term (current) drug therapy: Secondary | ICD-10-CM | POA: Insufficient documentation

## 2016-03-15 DIAGNOSIS — Y929 Unspecified place or not applicable: Secondary | ICD-10-CM | POA: Insufficient documentation

## 2016-03-15 NOTE — ED Provider Notes (Signed)
[facility] Provider Note   CSN: [id number] Arrival date & time: 03/15/16  1619 By signing my name below, I, [name], attest that this documentation has been prepared under the direction and in the presence of Langston MaskerKaren Rodnisha Blomgren, [place]PA-C. Electronically Signed: [name], ED Scribe. 03/15/16. 5:18 PM.  History   Chief Complaint Chief Complaint  Patient presents with  . Foot Injury    HPI The history is provided by the patient. No language interpreter was used.   HPI Comments: Sara SeatsYvette Francis is a 51 y.o. female with h/o asthma, HLD, HTN, and GERD, who presents to the Emergency Department complaining of pain in her right 3rd, 4th, and 5th toes following a mechanical injury three days ago. Pt states an "iron chair" fell on her foot. Pt states pain is exacerbated with bending the toes. She has not tried any OTC medications PTA. Denies any additional injuries. She denies fever, chills, focal numbness, or any other associated symptoms.  Past Medical History:  Diagnosis Date  . Arthritis   . Asthma   . Complication of anesthesia    hard to wake up  . GERD (gastroesophageal reflux disease)    used to take nexium  . Heart murmur   . Hyperlipemia   . Hypertension    under control with med., has been on med. x "a while"   . Wears glasses     There are no active problems to display for this patient.   Past Surgical History:  Procedure Laterality Date  . ABDOMINAL HYSTERECTOMY    . KNEE ARTHROSCOPY Right 05/15/2012   Procedure: RIGHT KNEE ARTHROSCOPY  and CHONDROPLASTY;  Surgeon: Nestor LewandowskyFrank J Rowan, MD;  Location: [facility];  Service: Orthopedics;  Laterality: Right;  . TONSILLECTOMY    . TUBAL LIGATION      OB History    No data available       Home Medications    Prior to Admission medications   Medication Sig Start Date End Date Taking? Authorizing Provider  albuterol (PROVENTIL HFA;VENTOLIN HFA) 108 (90 BASE) MCG/ACT inhaler Inhale 2 puffs into the  lungs every 6 (six) hours as needed for wheezing.    Historical Provider, MD  amLODipine (NORVASC) 5 MG tablet Take 5 mg by mouth.    Historical Provider, MD  fluticasone-salmeterol (ADVAIR HFA) 115-21 MCG/ACT inhaler Inhale 2 puffs into the lungs as needed.    Historical Provider, MD  ibuprofen (ADVIL,MOTRIN) 600 MG tablet Take 1 tablet (600 mg total) by mouth every 8 (eight) hours as needed. 09/10/15   Trixie DredgeEmily West, PA-C  pantoprazole (PROTONIX) 40 MG tablet Take 40 mg by mouth daily.    Historical Provider, MD    Family History No family history on file.  Social History Social History  Substance Use Topics  . Smoking status: Never Smoker  . Smokeless tobacco: Former NeurosurgeonUser  . Alcohol use No     Allergies   Benadryl [diphenhydramine hcl]; Betadine [povidone iodine]; Contrast media [iodinated diagnostic agents]; and Latex   Review of Systems Review of Systems  Constitutional: Negative for chills and fever.  Musculoskeletal: Positive for arthralgias and myalgias.  Neurological: Negative for numbness.  All other systems reviewed and are negative.    Physical Exam Updated Vital Signs BP 138/86   Pulse 82   Temp 98.3 F (36.8 C) (Oral)   Resp 18   Ht 5\' 7"  (1.702 m)   Wt 195 lb (88.5 kg)   SpO2 98%   BMI 30.54 kg/m  Physical Exam  Constitutional: She appears well-developed and well-nourished.  HENT:  Head: Normocephalic.  Eyes: Conjunctivae are normal.  Cardiovascular: Normal rate.   Pulmonary/Chest: Effort normal. No respiratory distress.  Abdominal: She exhibits no distension.  Musculoskeletal: Normal range of motion. She exhibits edema and tenderness.  Tender right fourth and fifth toe, slight swelling.  Neurological: She is alert.  Skin: Skin is warm and dry.  Psychiatric: She has a normal mood and affect. Her behavior is normal.  Nursing note and vitals reviewed.    ED Treatments / Results  DIAGNOSTIC STUDIES: Oxygen Saturation is 98% on RA, normal by my  interpretation.    COORDINATION OF CARE: 5:17 PM Discussed treatment plan with pt at bedside and pt agreed to plan.  Labs (all labs ordered are listed, but only abnormal results are displayed) Labs Reviewed - No data to display  EKG  EKG Interpretation None       Radiology No results found.  Procedures Procedures (including critical care time)  Medications Ordered in ED Medications - No data to display   Initial Impression / Assessment and Plan / ED Course  I have reviewed the triage vital signs and the nursing notes.  Pertinent labs & imaging results that were available during my care of the patient were reviewed by me and considered in my medical decision making (see chart for details).     Pt counseled on xray results,  Pt placed in post op shoe and ace wrap  Final Clinical Impressions(s) / ED Diagnoses   Final diagnoses:  Contusion of fifth toe of right foot, initial encounter    New Prescriptions Discharge Medication List as of 03/15/2016  5:20 PM     No outpatient prescriptions have been marked as taking for the 03/15/16 encounter [facility] Encounter).   An After Visit Summary was printed and given to the patient.  I personally performed the services in this documentation, which was scribed in my presence.  The recorded information has been reviewed and considered.   Barnet Pall.     Elson Areas, PA-C 03/16/16 0051    Elson Areas, PA-C 03/16/16 [id number]    Vanetta Mulders, MD 03/18/16 [phone]

## 2016-03-15 NOTE — ED Triage Notes (Signed)
3 days ago she hit her right foot on a chair. Pain in her 3rd 4th and 5th toes.

## 2016-03-15 NOTE — Discharge Instructions (Signed)
Return if any problems.

## 2017-12-28 ENCOUNTER — Other Ambulatory Visit: Payer: Self-pay

## 2017-12-28 ENCOUNTER — Emergency Department (HOSPITAL_BASED_OUTPATIENT_CLINIC_OR_DEPARTMENT_OTHER)
Admission: EM | Admit: 2017-12-28 | Discharge: 2017-12-28 | Disposition: A | Discharge status: Home / Self Care | Payer: Medicaid Other | Attending: Emergency Medicine | Admitting: Emergency Medicine

## 2017-12-28 ENCOUNTER — Emergency Department (HOSPITAL_BASED_OUTPATIENT_CLINIC_OR_DEPARTMENT_OTHER): Payer: Medicaid Other

## 2017-12-28 ENCOUNTER — Encounter (HOSPITAL_BASED_OUTPATIENT_CLINIC_OR_DEPARTMENT_OTHER): Payer: Self-pay | Admitting: *Deleted

## 2017-12-28 DIAGNOSIS — Z79899 Other long term (current) drug therapy: Secondary | ICD-10-CM | POA: Diagnosis not present

## 2017-12-28 DIAGNOSIS — I10 Essential (primary) hypertension: Secondary | ICD-10-CM | POA: Insufficient documentation

## 2017-12-28 DIAGNOSIS — J4531 Mild persistent asthma with (acute) exacerbation: Secondary | ICD-10-CM | POA: Insufficient documentation

## 2017-12-28 DIAGNOSIS — E785 Hyperlipidemia, unspecified: Secondary | ICD-10-CM | POA: Diagnosis not present

## 2017-12-28 DIAGNOSIS — R0981 Nasal congestion: Secondary | ICD-10-CM | POA: Diagnosis present

## 2017-12-28 DIAGNOSIS — K219 Gastro-esophageal reflux disease without esophagitis: Secondary | ICD-10-CM | POA: Insufficient documentation

## 2017-12-28 DIAGNOSIS — J069 Acute upper respiratory infection, unspecified: Secondary | ICD-10-CM | POA: Diagnosis not present

## 2017-12-28 MED ORDER — PREDNISONE 10 MG PO TABS: 40.0000 mg | ORAL_TABLET | Freq: Every day | ORAL | 0 refills | Status: AC

## 2017-12-28 MED ORDER — ALBUTEROL SULFATE (5 MG/ML) 0.5% IN NEBU: 2.5000 mg | INHALATION_SOLUTION | Freq: Four times a day (QID) | RESPIRATORY_TRACT | 0 refills | Status: AC | PRN

## 2017-12-28 MED ORDER — ALBUTEROL SULFATE HFA 108 (90 BASE) MCG/ACT IN AERS
1.0000 | INHALATION_SPRAY | Freq: Once | RESPIRATORY_TRACT | Status: AC
  Administered 2017-12-28: 1 via RESPIRATORY_TRACT
  Filled 2017-12-28: qty 6.7

## 2017-12-28 MED ORDER — PREDNISONE 50 MG PO TABS
60.0000 mg | ORAL_TABLET | Freq: Once | ORAL | Status: AC
  Administered 2017-12-28: 60 mg via ORAL
  Filled 2017-12-28: qty 1

## 2017-12-28 MED ORDER — ALBUTEROL SULFATE HFA 108 (90 BASE) MCG/ACT IN AERS
INHALATION_SPRAY | RESPIRATORY_TRACT | Status: AC
  Filled 2017-12-28: qty 6.7

## 2017-12-28 MED ORDER — FLUTICASONE PROPIONATE 50 MCG/ACT NA SUSP: 2.0000 | Freq: Every day | NASAL | 0 refills | Status: AC

## 2017-12-28 MED ORDER — IPRATROPIUM-ALBUTEROL 0.5-2.5 (3) MG/3ML IN SOLN
3.0000 mL | Freq: Four times a day (QID) | RESPIRATORY_TRACT | Status: DC
  Administered 2017-12-28: 3 mL via RESPIRATORY_TRACT
  Filled 2017-12-28: qty 3

## 2017-12-28 NOTE — ED Provider Notes (Signed)
[facility] EMERGENCY DEPARTMENT Provider Note   CSN: [id number] Arrival date & time: 12/28/17  1818     History   Chief Complaint Chief Complaint  Patient presents with  . Asthma    HPI Sara Francis is a 52 y.o. female with history of arthritis, asthma, GERD, HLD, HTN presents for evaluation of gradual onset, progressively worsening nasal congestion, shortness of breath, wheezing, and chest tightness for 3 days.  She notes subjective fevers and chills, generalized myalgias, sharp aching right ear pain, nasal congestion, and scratchy throat.  She notes cough productive of green sputum.  Also notes aching pain to the chest wall with cough.  Has tried sleeping without improvement in her symptoms.  Has not tried any medications but states she needs refills of her albuterol inhaler and nebulizers.  She is a non-smoker, no recent travel or surgeries, no leg swelling, no hemoptysis, no prior history of DVT or PE.  She is not on estrogen or hormone replacement therapy. Denies sore throat or ear drainage.  The history is provided by the patient.    Past Medical History:  Diagnosis Date  . Arthritis   . Asthma   . Complication of anesthesia    hard to wake up  . GERD (gastroesophageal reflux disease)    used to take nexium  . Heart murmur   . Hyperlipemia   . Hypertension    under control with med., has been on med. x "a while"   . Wears glasses     There are no active problems to display for this patient.   Past Surgical History:  Procedure Laterality Date  . ABDOMINAL HYSTERECTOMY    . KNEE ARTHROSCOPY Right 05/15/2012   Procedure: RIGHT KNEE ARTHROSCOPY  and CHONDROPLASTY;  Surgeon: Nestor Lewandowsky, MD;  Location: [facility];  Service: Orthopedics;  Laterality: Right;  . TONSILLECTOMY    . TUBAL LIGATION       OB History   None      Home Medications    Prior to Admission medications   Medication Sig Start Date End Date Taking? Authorizing  Provider  albuterol (PROVENTIL HFA;VENTOLIN HFA) 108 (90 BASE) MCG/ACT inhaler Inhale 2 puffs into the lungs every 6 (six) hours as needed for wheezing.    [provider]  albuterol (PROVENTIL) (5 MG/ML) 0.5% nebulizer solution Take 0.5 mLs (2.5 mg total) by nebulization every 6 (six) hours as needed for wheezing or shortness of breath. 12/28/17   Shanaiya Bene A, PA-C  amLODipine (NORVASC) 5 MG tablet Take 5 mg by mouth.    [provider]  fluticasone (FLONASE) 50 MCG/ACT nasal spray Place 2 sprays into both nostrils daily. 12/28/17   Sopheap Boehle A, PA-C  fluticasone-salmeterol (ADVAIR HFA) 409-81 MCG/ACT inhaler Inhale 2 puffs into the lungs as needed.    [provider]  ibuprofen (ADVIL,MOTRIN) 600 MG tablet Take 1 tablet (600 mg total) by mouth every 8 (eight) hours as needed. 09/10/15   Trixie Dredge, PA-C  pantoprazole (PROTONIX) 40 MG tablet Take 40 mg by mouth daily.    [provider]  predniSONE (DELTASONE) 10 MG tablet Take 4 tablets (40 mg total) by mouth daily with breakfast for 4 days. 12/28/17 01/01/18  Jeanie Sewer, PA-C    Family History History reviewed. No pertinent family history.  Social History Social History   Tobacco Use  . Smoking status: Never Smoker  . Smokeless tobacco: Former Engineer, water Use Topics  . Alcohol  use: No  . Drug use: No     Allergies   Benadryl [diphenhydramine hcl]; Betadine [povidone iodine]; Contrast media [iodinated diagnostic agents]; and Latex   Review of Systems Review of Systems  Constitutional: Positive for chills and fever.  HENT: Positive for ear pain. Negative for ear discharge and sore throat.   Respiratory: Positive for cough, chest tightness and shortness of breath.   Cardiovascular: Positive for chest pain (with cough).     Physical Exam Updated Vital Signs BP (!) 157/88 (BP Location: Right Arm)   Pulse 88   Temp 98.2 F (36.8 C) (Oral)   Resp 20   Ht 5\' 7"  (1.702 m)   Wt  88.5 kg   SpO2 99%   BMI 30.54 kg/m   Physical Exam  Constitutional: She appears well-developed and well-nourished. No distress.  HENT:  Head: Normocephalic and atraumatic.  Right TM with middle ear effusion.  No erythema or bulging.  No canal stenosis or abnormal drainage.  No mastoid tenderness.  Nasal septum midline, mucosal edema noted bilaterally.  Posterior oropharynx without erythema, tonsillar hypertrophy, exudates, trismus, or uvular deviation.  Tolerating secretions without difficulty.  No trismus.  Eyes: Pupils are equal, round, and reactive to light. Conjunctivae and EOM are normal. Right eye exhibits no discharge. Left eye exhibits no discharge.  Neck: Normal range of motion. Neck supple. No JVD present. No tracheal deviation present.  Cardiovascular: Normal rate, regular rhythm and normal heart sounds.  Pulmonary/Chest: Effort normal. She exhibits tenderness.  Globally diminished breath sounds.  Speaking in full sentences without difficulty.  Diffuse tenderness to palpation of the anterior chest wall and parasternal region with no deformity, crepitus, ecchymosis, or flail segment.  Abdominal: Soft. Bowel sounds are normal. She exhibits no distension. There is no tenderness.  Musculoskeletal: She exhibits no edema.  Lymphadenopathy:    She has no cervical adenopathy.  Neurological: She is alert.  Skin: Skin is warm and dry. No erythema.  Psychiatric: She has a normal mood and affect. Her behavior is normal.  Nursing note and vitals reviewed.    ED Treatments / Results  Labs (all labs ordered are listed, but only abnormal results are displayed) Labs Reviewed - No data to display  EKG None  Radiology Dg Chest 2 View  Result Date: 12/28/2017 CLINICAL DATA:  Shortness of breath beginning yesterday.  Asthma. EXAM: CHEST - 2 VIEW COMPARISON:  None. FINDINGS: The heart size and mediastinal contours are within normal limits. Both lungs are clear. The visualized skeletal  structures are unremarkable. IMPRESSION: No active cardiopulmonary disease. Electronically Signed   By: Myles Rosenthal M.D.   On: 12/28/2017 20:07    Procedures Procedures (including critical care time)  Medications Ordered in ED Medications  albuterol (PROVENTIL HFA;VENTOLIN HFA) 108 (90 Base) MCG/ACT inhaler 1 puff (1 puff Inhalation Given 12/28/17 1909)  predniSONE (DELTASONE) tablet 60 mg (60 mg Oral Given 12/28/17 1923)     Initial Impression / Assessment and Plan / ED Course  I have reviewed the triage vital signs and the nursing notes.  Pertinent labs & imaging results that were available during my care of the patient were reviewed by me and considered in my medical decision making (see chart for details).     Patient presented with URI symptoms and asthma exacerbation.  She is afebrile, mildly hypertensive.  Nontoxic in appearance.  She is globally diminished breath sounds on initial assessment with improvement after administration of breathing treatment.  No increased work of breathing on  examination, SPO2 saturations stable while in the ED.  Chest x-ray shows no evidence of acute cardiopulmonary abnormality with specifically no evidence of pneumonia or pleural effusion.  Presentation consistent with asthma exacerbation secondary to URI.  Doubt PE, ACS/MI.  Chest pain is reproducible on palpation, appears to be musculoskeletal in nature secondary to persistent cough.  Will discharge with prednisone burst, Flonase, and refill of albuterol inhaler and nebulizers.  Recommend follow-up with PCP if symptoms persist.  Discussed strict ED return precautions.  Patient and patient's daughter verbalized understanding of and agreement with plan and patient is stable for discharge home at this time.  Final Clinical Impressions(s) / ED Diagnoses   Final diagnoses:  Mild persistent asthma with exacerbation  URI with cough and congestion    ED Discharge Orders         Ordered    fluticasone  (FLONASE) 50 MCG/ACT nasal spray  Daily     12/28/17 2029    predniSONE (DELTASONE) 10 MG tablet  Daily with breakfast     12/28/17 2029    albuterol (PROVENTIL) (5 MG/ML) 0.5% nebulizer solution  Every 6 hours PRN     12/28/17 2029           Jeanie SewerFawze, Maecy Podgurski A, PA-C 12/29/17 Audley Hose0021    Pickering, Nathan, MD 12/31/17 [phone]

## 2017-12-28 NOTE — Progress Notes (Signed)
RT called to assess patient, BBS clear, slightly diminished in bases. No distress noted. RN assessed, and patient back in waiting room.

## 2017-12-28 NOTE — ED Triage Notes (Signed)
Pt amb to triage with quick steady gait, resp easy and reg in nad. Pt reports "asthma flare" x yesterday, has not used her inhaler or home nebulizer. Pt states she aches all over. Rt nicky to triage to assess.

## 2017-12-28 NOTE — Discharge Instructions (Addendum)
Continue to stay well-hydrated. Gargle warm salt water and spit it out for sore throat. May also use cough drops, warm teas, etc. Take flonase to decrease nasal congestion.  Albuterol inhaler every 4-6 hours as needed for shortness of breath.  Take prednisone as prescribed beginning tomorrow.  You received the first dose in the emergency department today. Take [phone] mg of Tylenol every 6 hours as needed for pain. Do not exceed 4000 mg of Tylenol daily.   Followup with your primary care doctor in 5-7 days for recheck of ongoing symptoms. Return to emergency department for emergent changing or worsening of symptoms such as throat tightness, facial swelling, fever not controlled by ibuprofen or Tylenol,difficulty breathing despite use of albuterol, or chest pain.

## 2018-06-10 ENCOUNTER — Emergency Department (HOSPITAL_BASED_OUTPATIENT_CLINIC_OR_DEPARTMENT_OTHER): Payer: Medicare HMO

## 2018-06-10 ENCOUNTER — Emergency Department (HOSPITAL_BASED_OUTPATIENT_CLINIC_OR_DEPARTMENT_OTHER)
Admission: EM | Admit: 2018-06-10 | Discharge: 2018-06-10 | Disposition: A | Discharge status: Home / Self Care | Payer: Medicare HMO | Attending: Emergency Medicine | Admitting: Emergency Medicine

## 2018-06-10 ENCOUNTER — Other Ambulatory Visit: Payer: Self-pay

## 2018-06-10 ENCOUNTER — Encounter (HOSPITAL_BASED_OUTPATIENT_CLINIC_OR_DEPARTMENT_OTHER): Payer: Self-pay | Admitting: Emergency Medicine

## 2018-06-10 DIAGNOSIS — M545 Low back pain: Secondary | ICD-10-CM | POA: Insufficient documentation

## 2018-06-10 DIAGNOSIS — Y939 Activity, unspecified: Secondary | ICD-10-CM | POA: Diagnosis not present

## 2018-06-10 DIAGNOSIS — E119 Type 2 diabetes mellitus without complications: Secondary | ICD-10-CM | POA: Insufficient documentation

## 2018-06-10 DIAGNOSIS — M25462 Effusion, left knee: Secondary | ICD-10-CM | POA: Insufficient documentation

## 2018-06-10 DIAGNOSIS — M25562 Pain in left knee: Secondary | ICD-10-CM | POA: Insufficient documentation

## 2018-06-10 DIAGNOSIS — J45909 Unspecified asthma, uncomplicated: Secondary | ICD-10-CM | POA: Diagnosis not present

## 2018-06-10 DIAGNOSIS — Y999 Unspecified external cause status: Secondary | ICD-10-CM | POA: Insufficient documentation

## 2018-06-10 DIAGNOSIS — M7918 Myalgia, other site: Secondary | ICD-10-CM

## 2018-06-10 DIAGNOSIS — Z91040 Latex allergy status: Secondary | ICD-10-CM | POA: Insufficient documentation

## 2018-06-10 DIAGNOSIS — Y929 Unspecified place or not applicable: Secondary | ICD-10-CM | POA: Insufficient documentation

## 2018-06-10 DIAGNOSIS — I10 Essential (primary) hypertension: Secondary | ICD-10-CM | POA: Diagnosis not present

## 2018-06-10 DIAGNOSIS — Z79899 Other long term (current) drug therapy: Secondary | ICD-10-CM | POA: Diagnosis not present

## 2018-06-10 DIAGNOSIS — M25511 Pain in right shoulder: Secondary | ICD-10-CM | POA: Diagnosis present

## 2018-06-10 HISTORY — DX: Type 2 diabetes mellitus without complications: E11.9

## 2018-06-10 MED ORDER — METHOCARBAMOL 500 MG PO TABS: 500.0000 mg | ORAL_TABLET | Freq: Four times a day (QID) | ORAL | 0 refills | Status: AC
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
## 2018-06-10 NOTE — ED Notes (Signed)
Patient transported to X-ray 
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
## 2020-12-03 LAB — HM DIABETES EYE EXAM
